# Patient Record
Sex: Male | Born: 1970 | Race: White | Hispanic: No | Marital: Married | Smoking: Former smoker
Health system: Southern US, Community
[De-identification: ages and names within clinical notes are randomized; demographics above are authoritative.]

## PROBLEM LIST (undated history)

## (undated) DIAGNOSIS — R569 Unspecified convulsions: Secondary | ICD-10-CM

## (undated) DIAGNOSIS — G43909 Migraine, unspecified, not intractable, without status migrainosus: Secondary | ICD-10-CM

## (undated) DIAGNOSIS — G4700 Insomnia, unspecified: Secondary | ICD-10-CM

## (undated) DIAGNOSIS — R0600 Dyspnea, unspecified: Secondary | ICD-10-CM

## (undated) DIAGNOSIS — R109 Unspecified abdominal pain: Secondary | ICD-10-CM

## (undated) DIAGNOSIS — G4730 Sleep apnea, unspecified: Secondary | ICD-10-CM

## (undated) DIAGNOSIS — F40240 Claustrophobia: Secondary | ICD-10-CM

## (undated) DIAGNOSIS — M1990 Unspecified osteoarthritis, unspecified site: Secondary | ICD-10-CM

## (undated) DIAGNOSIS — F32A Depression, unspecified: Secondary | ICD-10-CM

## (undated) DIAGNOSIS — L409 Psoriasis, unspecified: Secondary | ICD-10-CM

## (undated) DIAGNOSIS — M7520 Bicipital tendinitis, unspecified shoulder: Secondary | ICD-10-CM

## (undated) DIAGNOSIS — K219 Gastro-esophageal reflux disease without esophagitis: Secondary | ICD-10-CM

## (undated) DIAGNOSIS — I749 Embolism and thrombosis of unspecified artery: Secondary | ICD-10-CM

## (undated) DIAGNOSIS — F4310 Post-traumatic stress disorder, unspecified: Secondary | ICD-10-CM

## (undated) DIAGNOSIS — M25519 Pain in unspecified shoulder: Secondary | ICD-10-CM

## (undated) DIAGNOSIS — L4050 Arthropathic psoriasis, unspecified: Secondary | ICD-10-CM

## (undated) DIAGNOSIS — I10 Essential (primary) hypertension: Secondary | ICD-10-CM

## (undated) DIAGNOSIS — I824Z2 Acute embolism and thrombosis of unspecified deep veins of left distal lower extremity: Secondary | ICD-10-CM

## (undated) DIAGNOSIS — H9319 Tinnitus, unspecified ear: Secondary | ICD-10-CM

## (undated) DIAGNOSIS — K589 Irritable bowel syndrome without diarrhea: Secondary | ICD-10-CM

## (undated) HISTORY — PX: LASIK: SHX215

## (undated) HISTORY — DX: Unspecified abdominal pain: R10.9

## (undated) HISTORY — DX: Gastro-esophageal reflux disease without esophagitis: K21.9

## (undated) HISTORY — PX: COLONOSCOPY: SHX174

## (undated) HISTORY — DX: Migraine, unspecified, not intractable, without status migrainosus: G43.909

## (undated) HISTORY — PX: APPENDECTOMY: SHX54

## (undated) HISTORY — DX: Post-traumatic stress disorder, unspecified: F43.10

## (undated) HISTORY — DX: Tinnitus, unspecified ear: H93.19

---

## 2018-11-10 ENCOUNTER — Other Ambulatory Visit: Payer: Self-pay | Admitting: Internal Medicine

## 2018-11-10 ENCOUNTER — Other Ambulatory Visit (HOSPITAL_COMMUNITY): Payer: Self-pay | Admitting: Internal Medicine

## 2018-11-10 DIAGNOSIS — R1011 Right upper quadrant pain: Secondary | ICD-10-CM

## 2018-11-19 ENCOUNTER — Ambulatory Visit (HOSPITAL_COMMUNITY): Payer: Non-veteran care

## 2018-11-19 ENCOUNTER — Encounter (HOSPITAL_COMMUNITY): Payer: Self-pay

## 2018-11-19 ENCOUNTER — Other Ambulatory Visit: Payer: Self-pay | Admitting: Internal Medicine

## 2018-11-19 DIAGNOSIS — R1011 Right upper quadrant pain: Secondary | ICD-10-CM

## 2018-11-25 ENCOUNTER — Ambulatory Visit
Admission: RE | Admit: 2018-11-25 | Discharge: 2018-11-25 | Disposition: A | Discharge status: Home / Self Care | Payer: No Typology Code available for payment source | Source: Ambulatory Visit | Attending: Internal Medicine | Admitting: Internal Medicine

## 2018-11-25 DIAGNOSIS — R1011 Right upper quadrant pain: Secondary | ICD-10-CM | POA: Diagnosis not present

## 2020-03-12 ENCOUNTER — Encounter: Payer: Self-pay | Admitting: Gastroenterology

## 2020-04-17 ENCOUNTER — Other Ambulatory Visit (INDEPENDENT_AMBULATORY_CARE_PROVIDER_SITE_OTHER): Payer: No Typology Code available for payment source

## 2020-04-17 ENCOUNTER — Ambulatory Visit (INDEPENDENT_AMBULATORY_CARE_PROVIDER_SITE_OTHER): Payer: No Typology Code available for payment source | Admitting: Gastroenterology

## 2020-04-17 ENCOUNTER — Encounter: Payer: Self-pay | Admitting: Gastroenterology

## 2020-04-17 DIAGNOSIS — R195 Other fecal abnormalities: Secondary | ICD-10-CM

## 2020-04-17 DIAGNOSIS — R1013 Epigastric pain: Secondary | ICD-10-CM | POA: Diagnosis not present

## 2020-04-17 DIAGNOSIS — R1084 Generalized abdominal pain: Secondary | ICD-10-CM

## 2020-04-17 LAB — CBC
HCT: 47.8 % (ref 39.0–52.0)
Hemoglobin: 16.8 g/dL (ref 13.0–17.0)
MCHC: 35.2 g/dL (ref 30.0–36.0)
MCV: 95.8 fl (ref 78.0–100.0)
Platelets: 237 10*3/uL (ref 150.0–400.0)
RBC: 4.99 Mil/uL (ref 4.22–5.81)
RDW: 13.1 % (ref 11.5–15.5)
WBC: 7.3 10*3/uL (ref 4.0–10.5)

## 2020-04-17 LAB — COMPREHENSIVE METABOLIC PANEL
ALT: 24 U/L (ref 0–53)
AST: 26 U/L (ref 0–37)
Albumin: 4.5 g/dL (ref 3.5–5.2)
Alkaline Phosphatase: 88 U/L (ref 39–117)
BUN: 15 mg/dL (ref 6–23)
CO2: 27 mEq/L (ref 19–32)
Calcium: 9.8 mg/dL (ref 8.4–10.5)
Chloride: 102 mEq/L (ref 96–112)
Creatinine, Ser: 1.07 mg/dL (ref 0.40–1.50)
GFR: 72.97 mL/min (ref >60.00)
Glucose, Bld: 89 mg/dL (ref 70–99)
Potassium: 4 mEq/L (ref 3.5–5.1)
Sodium: 137 mEq/L (ref 135–145)
Total Bilirubin: 0.5 mg/dL (ref 0.2–1.2)
Total Protein: 7.4 g/dL (ref 6.0–8.3)

## 2020-04-17 LAB — SEDIMENTATION RATE: Sed Rate: 13 mm/hr (ref 0–20)

## 2020-04-17 LAB — IGA: IgA: 343 mg/dL (ref 68–378)

## 2020-04-17 NOTE — Patient Instructions (Signed)
If you are age 51 or older, your body mass index should be between 23-30. Your Body mass index is 39.2 kg/m. If this is out of the aforementioned range listed, please consider follow up with your Primary Care Provider.  If you are age 86 or younger, your body mass index should be between 19-25. Your Body mass index is 39.2 kg/m. If this is out of the aformentioned range listed, please consider follow up with your Primary Care Provider.   You have been scheduled for a CT scan of the abdomen and pelvis at [facility] ([address]---this is in the same building as [employer]).   You are scheduled on 04-19-20 at 2:30pm You should arrive 15 minutes prior to your appointment time for registration. Please follow the written instructions below on the day of your exam:  WARNING: IF YOU ARE ALLERGIC TO IODINE/X-RAY DYE, PLEASE NOTIFY RADIOLOGY IMMEDIATELY AT [phone]! YOU WILL BE GIVEN A 13 HOUR PREMEDICATION PREP.  1) Do not eat or drink anything after 10:30am (4 hours prior to your test) 2) You have been given 2 bottles of oral contrast to drink. The solution may taste better if refrigerated, but do NOT add ice or any other liquid to this solution. Shake well before drinking.    Drink 1 bottle of contrast @ 12:30pm (2 hours prior to your exam)  Drink 1 bottle of contrast @ 1:30pm (1 hour prior to your exam)  You may take any medications as prescribed with a small amount of water, if necessary. If you take any of the following medications: METFORMIN, GLUCOPHAGE, GLUCOVANCE, AVANDAMET, RIOMET, FORTAMET, [place] MET, JANUMET, GLUMETZA or METAGLIP, you MAY be asked to HOLD this medication 48 hours AFTER the exam.  The purpose of you drinking the oral contrast is to aid in the visualization of your intestinal tract. The contrast solution may cause some diarrhea. Depending on your individual set of symptoms, you may also receive an intravenous injection of x-ray contrast/dye.  Plan on being at [facility] for 30 minutes or longer, depending on the type of exam you are having performed.  This test typically takes 30-45 minutes to complete.  If you have any questions regarding your exam or if you need to reschedule, you may call the CT department at [phone] between the hours of 8:00 am and 5:00 pm, Monday-Friday.  __________________________________________________________  Your provider has requested that you go to the basement level for lab work before leaving today. Press "B" on the elevator. The lab is located at the first door on the left as you exit the elevator.   Due to recent changes in healthcare laws, you may see the results of your imaging and laboratory studies on MyChart before your provider has had a chance to review them.  We understand that in some cases there may be results that are confusing or concerning to you. Not all laboratory results come back in the same time frame and the provider may be waiting for multiple results in order to interpret others.  Please give [place] 48 hours in order for your provider to thoroughly review all the results before contacting the office for clarification of your results.   Thank you for entrusting me with your care and choosing [facility].  Dr Ardis Hughs

## 2020-04-17 NOTE — Progress Notes (Signed)
HPI: This is a very pleasant 49 year old man who is here with his wife today.  He was referred by a gastroenterologist at the [facility] [place] in [place].  He has had epigastric and generalized abdominal pains for many years.  He says the pain is a sharp pain.  Is nearly always there but can sometimes be worse.  He is not sure what makes it worse.  Eating does not necessarily make it worse.  He also has a hunger type pain.  He rarely has nausea with this and never vomits.  He has avoided gluten without any difference he is avoiding beer without any difference.  He is avoiding spicy foods without any difference.  He tells me that he has had testing through the [facility] [place] system including an ultrasound however he does not know the results of that.  This was over a year ago and he never heard back about those results.  He has intermittent heartburn at times especially at night.  He has tried several different antiacid medicines without much improvement.  He does not take NSAIDs.    He lost 10 pounds about 6 months ago and has not regained that.  He has loose stools frequently.  He would generally move his bowels 4-7 times a day.  Sometimes it is solid but oftentimes it is loose.  He has seen mild intermittent bright red blood per rectum.  Colon cancer does not run in his family  Old Data Reviewed: I reviewed a 62 page packet from the [facility] in [facility].  It looks like they are asking for a colonoscopy to be done.  There is very limited clinical information in the packet.  I believe they are asking that a colonoscopy done for abdominal pain and that his BMI is greater than 40 and so he needs MAC type sedation.  There is a medication list included.  It appears accurate.  There is a progress note included.  It looks like he was evaluated by a Gastroenterologist in their office March 2021 for "abdominal pain and diarrhea" the report reads that he has chronic abdominal pain and diarrhea.  "His symptoms are unchanged  and if anything it abdominal pain is worse.  He describes the discomfort as if he is "starving to death" he clarifies it is sharp sharp pain in the periumbilical area.  There was some lab test results included from 2 to 3 years prior.  In the assessment of this progress note the plan was "EGD and colonoscopy"  To be clear in the [facility] packet of information the most recent lab test results were from 2 years ago and there were no imaging results at all.  It looks like the referring physician was a gastroenterologist at the [facility] named Saundra Shelling, MD    Review of systems: Pertinent positive and negative review of systems were noted in the above HPI section. All other review negative.   Past Medical History:  Diagnosis Date  . Abdominal pain   . GERD (gastroesophageal reflux disease)   . PTSD (post-traumatic stress disorder)   . Tinnitus       Current Outpatient Medications  Medication Sig Dispense Refill  . DULoxetine HCl 20 MG CSDR Take 1 capsule by mouth daily.    Marland Kitchen gabapentin (NEURONTIN) 100 MG capsule Take 100 mg by mouth at bedtime.     No current facility-administered medications for this visit.    Allergies as of 04/17/2020  . (No Known Allergies)    Family History  Family history unknown: Yes    Social History   Socioeconomic History  . Marital status: Unknown    Spouse name: Not on file  . Number of children: Not on file  . Years of education: Not on file  . Highest education level: Not on file  Occupational History  . Not on file  Tobacco Use  . Smoking status: Former Research scientist (life sciences)  . Smokeless tobacco: Never Used  Vaping Use  . Vaping Use: Never used  Substance and Sexual Activity  . Alcohol use: Yes  . Drug use: Never  . Sexual activity: Not on file  Other Topics Concern  . Not on file  Social History Narrative  . Not on file   Social Determinants of Health   Financial Resource Strain:   . Difficulty of Paying Living Expenses:   Food Insecurity:   .  Worried About Charity fundraiser in the Last Year:   . Arboriculturist in the Last Year:   Transportation Needs:   . Film/video editor (Medical):   Marland Kitchen Lack of Transportation (Non-Medical):   Physical Activity:   . Days of Exercise per Week:   . Minutes of Exercise per Session:   Stress:   . Feeling of Stress :   Social Connections:   . Frequency of Communication with Friends and Family:   . Frequency of Social Gatherings with Friends and Family:   . Attends Religious Services:   . Active Member of Clubs or Organizations:   . Attends Archivist Meetings:   Marland Kitchen Marital Status:   Intimate Partner Violence:   . Fear of Current or Ex-Partner:   . Emotionally Abused:   Marland Kitchen Physically Abused:   . Sexually Abused:      Physical Exam: BP (!) 134/94 (BP Location: Left Arm, Patient Position: Sitting, Cuff Size: Large)   Pulse 100   Ht 5' 10.5" (1.791 m)   Wt 277 lb 2 oz (125.7 kg)   SpO2 100%   BMI 39.20 kg/m  Constitutional: generally well-appearing Psychiatric: alert and oriented x3 Eyes: extraocular movements intact Mouth: oral pharynx moist, no lesions Neck: supple no lymphadenopathy Cardiovascular: heart regular rate and rhythm Lungs: clear to auscultation bilaterally Abdomen: soft, nontender, nondistended, no obvious ascites, no peritoneal signs, normal bowel sounds Extremities: no lower extremity edema bilaterally Skin: no lesions on visible extremities   Assessment and plan: 49 y.o. male with generalized abdominal pains, frequent stools, epigastric abdominal pains  [facility] however I do not think that those are the best first tests for him.  He has not had blood testing in quite a while.  I do not believe he has ever had abdominal imaging's with a CT scan.  I recommended that we start his work-up with CT scan abdomen pelvis with IV and oral contrast as well as lab tests including a CBC,  complete metabolic profile, sed rate, TTG and total IgA as well as stool for GI pathogen panel.  He understands that if no obvious causes are noted on the above testing that he will probably need further testing which may include colonoscopy and also upper endoscopy.    Please see the "Patient Instructions" section for addition details about the plan.   Owens Loffler, MD [facility] 04/17/2020, 3:14 PM  Cc: Irean Hong, FNP  Total time on date of encounter was 45  minutes (this included time spent preparing to see the  patient reviewing records; obtaining and/or reviewing separately obtained history; performing a medically appropriate exam and/or evaluation; counseling and educating the patient and family if present; ordering medications, tests or procedures if applicable; and documenting clinical information in the health record).

## 2020-04-18 LAB — TISSUE TRANSGLUTAMINASE, IGA: (tTG) Ab, IgA: 1 U/mL

## 2020-04-19 ENCOUNTER — Other Ambulatory Visit: Payer: No Typology Code available for payment source

## 2020-04-19 ENCOUNTER — Other Ambulatory Visit: Payer: Self-pay

## 2020-04-19 ENCOUNTER — Ambulatory Visit (INDEPENDENT_AMBULATORY_CARE_PROVIDER_SITE_OTHER)
Admission: RE | Admit: 2020-04-19 | Discharge: 2020-04-19 | Disposition: A | Discharge status: Home / Self Care | Payer: No Typology Code available for payment source | Source: Ambulatory Visit | Attending: Gastroenterology | Admitting: Gastroenterology

## 2020-04-19 DIAGNOSIS — R195 Other fecal abnormalities: Secondary | ICD-10-CM

## 2020-04-19 DIAGNOSIS — R1013 Epigastric pain: Secondary | ICD-10-CM

## 2020-04-19 DIAGNOSIS — R1084 Generalized abdominal pain: Secondary | ICD-10-CM | POA: Diagnosis not present

## 2020-04-19 MED ORDER — IOHEXOL 300 MG/ML  SOLN
100.0000 mL | Freq: Once | INTRAMUSCULAR | Status: AC | PRN
  Administered 2020-04-19: 100 mL via INTRAVENOUS

## 2020-04-20 ENCOUNTER — Other Ambulatory Visit: Payer: Self-pay

## 2020-04-20 DIAGNOSIS — Z1159 Encounter for screening for other viral diseases: Secondary | ICD-10-CM

## 2020-04-22 LAB — GI PROFILE, STOOL, PCR

## 2020-05-17 ENCOUNTER — Ambulatory Visit (AMBULATORY_SURGERY_CENTER): Payer: Self-pay

## 2020-05-17 ENCOUNTER — Other Ambulatory Visit: Payer: Self-pay

## 2020-05-17 VITALS — Ht 70.5 in | Wt 271.0 lb

## 2020-05-17 DIAGNOSIS — Z01818 Encounter for other preprocedural examination: Secondary | ICD-10-CM

## 2020-05-17 DIAGNOSIS — R1013 Epigastric pain: Secondary | ICD-10-CM

## 2020-05-17 DIAGNOSIS — R195 Other fecal abnormalities: Secondary | ICD-10-CM

## 2020-05-17 DIAGNOSIS — R1084 Generalized abdominal pain: Secondary | ICD-10-CM

## 2020-05-17 NOTE — Progress Notes (Signed)
No egg or soy allergy known to patient  No issues with past sedation with any surgeries or procedures no intubation problems in the past  No diet pills per patient No home 02 use per patient  No blood thinners per patient  Pt denies issues with constipation  No A fib or A flutter  EMMI video to pt or MyChart  COVID 19 guidelines implemented in [place] today   Pt has golytely prep from April at home.    Due to the COVID-19 pandemic we are asking patients to follow these guidelines. Please only bring one care partner. Please be aware that your care partner may wait in the car in the parking lot or if they feel like they will be too hot to wait in the car, they may wait in the lobby on the 4th floor. All care partners are required to wear a mask the entire time (we do not have any that we can provide them), they need to practice social distancing, and we will do a Covid check for all patient's and care partners when you arrive. Also we will check their temperature and your temperature. If the care partner waits in their car they need to stay in the parking lot the entire time and we will call them on their cell phone when the patient is ready for discharge so they can bring the car to the front of the building. Also all patient's will need to wear a mask into building.

## 2020-05-23 ENCOUNTER — Other Ambulatory Visit: Payer: Self-pay | Admitting: Gastroenterology

## 2020-05-23 ENCOUNTER — Ambulatory Visit (INDEPENDENT_AMBULATORY_CARE_PROVIDER_SITE_OTHER): Payer: No Typology Code available for payment source

## 2020-05-23 DIAGNOSIS — Z1159 Encounter for screening for other viral diseases: Secondary | ICD-10-CM

## 2020-05-24 LAB — SARS CORONAVIRUS 2 (TAT 6-24 HRS): SARS Coronavirus 2: NEGATIVE

## 2020-05-25 ENCOUNTER — Other Ambulatory Visit: Payer: Self-pay

## 2020-05-25 ENCOUNTER — Encounter: Payer: Self-pay | Admitting: Gastroenterology

## 2020-05-25 ENCOUNTER — Ambulatory Visit (AMBULATORY_SURGERY_CENTER): Payer: No Typology Code available for payment source | Admitting: Gastroenterology

## 2020-05-25 VITALS — BP 140/90 | HR 70 | Temp 98.0°F | Resp 12 | Ht 70.0 in | Wt 271.0 lb

## 2020-05-25 DIAGNOSIS — K2970 Gastritis, unspecified, without bleeding: Secondary | ICD-10-CM | POA: Diagnosis not present

## 2020-05-25 DIAGNOSIS — K2980 Duodenitis without bleeding: Secondary | ICD-10-CM | POA: Diagnosis not present

## 2020-05-25 DIAGNOSIS — R197 Diarrhea, unspecified: Secondary | ICD-10-CM

## 2020-05-25 DIAGNOSIS — D123 Benign neoplasm of transverse colon: Secondary | ICD-10-CM

## 2020-05-25 DIAGNOSIS — R1013 Epigastric pain: Secondary | ICD-10-CM | POA: Diagnosis not present

## 2020-05-25 DIAGNOSIS — K449 Diaphragmatic hernia without obstruction or gangrene: Secondary | ICD-10-CM

## 2020-05-25 DIAGNOSIS — D124 Benign neoplasm of descending colon: Secondary | ICD-10-CM

## 2020-05-25 DIAGNOSIS — R195 Other fecal abnormalities: Secondary | ICD-10-CM

## 2020-05-25 DIAGNOSIS — D125 Benign neoplasm of sigmoid colon: Secondary | ICD-10-CM | POA: Diagnosis not present

## 2020-05-25 MED ORDER — SODIUM CHLORIDE 0.9 % IV SOLN: 500.0000 mL | INTRAVENOUS | Status: DC

## 2020-05-25 NOTE — Op Note (Signed)
[place] Patient Name: Saunders Arlington Procedure Date: 05/25/2020 2:17 PM MRN: [id number] Endoscopist: Milus Banister , MD Age: 49 Referring MD:  Date of Birth: 1971-09-18 Gender: Male Account #: [id number] Procedure:                Colonoscopy Indications:              loose stools, abdominal pain Medicines:                Monitored Anesthesia Care Procedure:                Pre-Anesthesia Assessment:                           - Prior to the procedure, a History and Physical                            was performed, and patient medications and                            allergies were reviewed. The patient's tolerance of                            previous anesthesia was also reviewed. The risks                            and benefits of the procedure and the sedation                            options and risks were discussed with the patient.                            All questions were answered, and informed consent                            was obtained. Prior Anticoagulants: The patient has                            taken no previous anticoagulant or antiplatelet                            agents. ASA Grade Assessment: II - A patient with                            mild systemic disease. After reviewing the risks                            and benefits, the patient was deemed in                            satisfactory condition to undergo the procedure.                           After obtaining informed consent, the colonoscope  was passed under direct vision. Throughout the                            procedure, the patient's blood pressure, pulse, and                            oxygen saturations were monitored continuously. The                            Colonoscope was introduced through the anus and                            advanced to the the terminal ileum. The colonoscopy                            was performed without  difficulty. The patient                            tolerated the procedure well. The quality of the                            bowel preparation was good. The terminal ileum,                            ileocecal valve, appendiceal orifice, and rectum                            were photographed. Scope In: 2:24:28 PM Scope Out: 2:42:33 PM Scope Withdrawal Time: 0 hours 16 minutes 18 seconds  Total Procedure Duration: 0 hours 18 minutes 5 seconds  Findings:                 The terminal ileum appeared normal.                           Five sessile polyps were found in the sigmoid                            colon, descending colon and transverse colon. The                            polyps were 3 to 9 mm in size. These polyps were                            removed with a cold snare. Resection and retrieval                            were complete.                           Multiple small and large-mouthed diverticula were                            found in the left colon.  The exam was otherwise without abnormality on                            direct and retroflexion views.                           Biopsies for histology were taken with a cold                            forceps from the entire colon for evaluation of                            microscopic colitis. Complications:            No immediate complications. Estimated blood loss:                            None. Estimated Blood Loss:     Estimated blood loss: none. Impression:               - The examined portion of the ileum was normal.                           - Five 3 to 9 mm polyps in the sigmoid colon, in                            the descending colon and in the transverse colon,                            removed with a cold snare. Resected and retrieved.                           - Diverticulosis in the left colon.                           - The examination was otherwise normal on direct                             and retroflexion views.                           - Biopsies were taken with a cold forceps from the                            entire colon for evaluation of microscopic colitis. Recommendation:           - EGD now.                           - Await pathology results. Milus Banister, MD 05/25/2020 2:51:07 PM This report has been signed electronically.

## 2020-05-25 NOTE — Progress Notes (Signed)
Called to room to assist during endoscopic procedure.  Patient ID and intended procedure confirmed with present staff. Received instructions for my participation in the procedure from the performing physician.  

## 2020-05-25 NOTE — Patient Instructions (Signed)
Handouts on polyps and diverticulosis given to you today  Await pathology results on polyps removed and biosies done  Handout on gastritis given to you today  Await pathology results on biopsies done    YOU HAD AN ENDOSCOPIC PROCEDURE TODAY AT [facility]:   Refer to the procedure report that was given to you for any specific questions about what was found during the examination.  If the procedure report does not answer your questions, please call your gastroenterologist to clarify.  If you requested that your care partner not be given the details of your procedure findings, then the procedure report has been included in a sealed envelope for you to review at your convenience later.  YOU SHOULD EXPECT: Some feelings of bloating in the abdomen. Passage of more gas than usual.  Walking can help get rid of the air that was put into your GI tract during the procedure and reduce the bloating. If you had a lower endoscopy (such as a colonoscopy or flexible sigmoidoscopy) you may notice spotting of blood in your stool or on the toilet paper. If you underwent a bowel prep for your procedure, you may not have a normal bowel movement for a few days.  Please Note:  You might notice some irritation and congestion in your nose or some drainage.  This is from the oxygen used during your procedure.  There is no need for concern and it should clear up in a day or so.  SYMPTOMS TO REPORT IMMEDIATELY:   Following lower endoscopy (colonoscopy or flexible sigmoidoscopy):  Excessive amounts of blood in the stool  Significant tenderness or worsening of abdominal pains  Swelling of the abdomen that is new, acute  Fever of 100F or higher   Following upper endoscopy (EGD)  Vomiting of blood or coffee ground material  New chest pain or pain under the shoulder blades  Painful or persistently difficult swallowing  New shortness of breath  Fever of 100F or higher  Black, tarry-looking  stools  For urgent or emergent issues, a gastroenterologist can be reached at any hour by calling [phone]. Do not use MyChart messaging for urgent concerns.    DIET:  We do recommend a small meal at first, but then you may proceed to your regular diet.  Drink plenty of fluids but you should avoid alcoholic beverages for 24 hours.  ACTIVITY:  You should plan to take it easy for the rest of today and you should NOT DRIVE or use heavy machinery until tomorrow (because of the sedation medicines used during the test).    FOLLOW UP: Our staff will call the number listed on your records 48-72 hours following your procedure to check on you and address any questions or concerns that you may have regarding the information given to you following your procedure. If we do not reach you, we will leave a message.  We will attempt to reach you two times.  During this call, we will ask if you have developed any symptoms of COVID 19. If you develop any symptoms (ie: fever, flu-like symptoms, shortness of breath, cough etc.) before then, please call [phone].  If you test positive for Covid 19 in the 2 weeks post procedure, please call and report this information to [place].    If any biopsies were taken you will be contacted by phone or by letter within the next 1-3 weeks.  Please call us at [phone] if you have not heard about the  biopsies in 3 weeks.    SIGNATURES/CONFIDENTIALITY: You and/or your care partner have signed paperwork which will be entered into your electronic medical record.  These signatures attest to the fact that that the information above on your After Visit Summary has been reviewed and is understood.  Full responsibility of the confidentiality of this discharge information lies with you and/or your care-partner.

## 2020-05-25 NOTE — Progress Notes (Signed)
Vs CW I have reviewed the patient's medical history in detail and updated the computerized patient record.   

## 2020-05-25 NOTE — Progress Notes (Signed)
PT taken to PACU. Monitors in place. VSS. Report given to RN. 

## 2020-05-25 NOTE — Op Note (Signed)
[place] Patient Name: Mike Cooley Procedure Date: 05/25/2020 2:17 PM MRN: [id number] Endoscopist: Milus Banister , MD Age: 49 Referring MD:  Date of Birth: 07-Feb-1971 Gender: Male Account #: [id number] Procedure:                Upper GI endoscopy Indications:              Generalized abdominal pain Medicines:                Monitored Anesthesia Care Procedure:                Pre-Anesthesia Assessment:                           - Prior to the procedure, a History and Physical                            was performed, and patient medications and                            allergies were reviewed. The patient's tolerance of                            previous anesthesia was also reviewed. The risks                            and benefits of the procedure and the sedation                            options and risks were discussed with the patient.                            All questions were answered, and informed consent                            was obtained. Prior Anticoagulants: The patient has                            taken no previous anticoagulant or antiplatelet                            agents. ASA Grade Assessment: II - A patient with                            mild systemic disease. After reviewing the risks                            and benefits, the patient was deemed in                            satisfactory condition to undergo the procedure.                           After obtaining informed consent, the endoscope was  passed under direct vision. Throughout the                            procedure, the patient's blood pressure, pulse, and                            oxygen saturations were monitored continuously. The                            Endoscope was introduced through the mouth, and                            advanced to the second part of duodenum. The upper                            GI endoscopy was accomplished  without difficulty.                            The patient tolerated the procedure well. Scope In: Scope Out: Findings:                 Small hiatal hernia.                           Moderate inflammation characterized by erosions,                            erythema and friability was found in the entire                            examined stomach. Biopsies were taken with a cold                            forceps for histology.                           Non-specific duodenitis (erythema and one erosion                            in the bulb)                           The exam was otherwise without abnormality. Complications:            No immediate complications. Estimated blood loss:                            None. Estimated Blood Loss:     Estimated blood loss: none. Impression:               - Small hiatal hernia.                           - Non specific gastritis (biopsied) and duodenitis.                           - The examination was otherwise normal.  Recommendation:           - Patient has a contact number available for                            emergencies. The signs and symptoms of potential                            delayed complications were discussed with the                            patient. Return to normal activities tomorrow.                            Written discharge instructions were provided to the                            patient.                           - Resume previous diet.                           - Continue present medications.                           - Await pathology results. Milus Banister, MD 05/25/2020 2:55:23 PM This report has been signed electronically.

## 2020-05-29 ENCOUNTER — Telehealth: Payer: Self-pay | Admitting: *Deleted

## 2020-05-29 NOTE — Telephone Encounter (Signed)
First attempt, left VM.  

## 2020-08-15 ENCOUNTER — Other Ambulatory Visit (HOSPITAL_BASED_OUTPATIENT_CLINIC_OR_DEPARTMENT_OTHER): Payer: Self-pay

## 2020-08-15 DIAGNOSIS — G4733 Obstructive sleep apnea (adult) (pediatric): Secondary | ICD-10-CM

## 2020-08-31 ENCOUNTER — Other Ambulatory Visit: Payer: Self-pay

## 2020-08-31 ENCOUNTER — Ambulatory Visit (HOSPITAL_BASED_OUTPATIENT_CLINIC_OR_DEPARTMENT_OTHER): Payer: No Typology Code available for payment source | Attending: Internal Medicine | Admitting: Internal Medicine

## 2020-08-31 VITALS — Ht 69.0 in | Wt 263.0 lb

## 2020-08-31 DIAGNOSIS — G4730 Sleep apnea, unspecified: Secondary | ICD-10-CM | POA: Diagnosis present

## 2020-08-31 DIAGNOSIS — G4761 Periodic limb movement disorder: Secondary | ICD-10-CM | POA: Insufficient documentation

## 2020-08-31 DIAGNOSIS — G4733 Obstructive sleep apnea (adult) (pediatric): Secondary | ICD-10-CM | POA: Diagnosis not present

## 2020-09-03 ENCOUNTER — Other Ambulatory Visit: Payer: Self-pay

## 2020-09-09 DIAGNOSIS — G4733 Obstructive sleep apnea (adult) (pediatric): Secondary | ICD-10-CM

## 2020-09-09 NOTE — Procedures (Signed)
   Patient Name: Mike Cooley, Mike Cooley Date: 08/31/2020 Gender: Male D.O.B: Oct 25, 1971 Age (years): 29 Referring Provider: Windy Fast Height (inches): 23 Interpreting Physician: Baird Lyons MD, ABSM Weight (lbs): 263 RPSGT: Earney Hamburg BMI: 39 MRN: [id number] Neck Size: 17.00  CLINICAL INFORMATION Sleep Study Type: NPSG Indication for sleep study: OSA Epworth Sleepiness Score: 17  SLEEP STUDY TECHNIQUE As per the AASM Manual for the Scoring of Sleep and Associated Events v2.3 (April 2016) with a hypopnea requiring 4% desaturations.  The channels recorded and monitored were frontal, central and occipital EEG, electrooculogram (EOG), submentalis EMG (chin), nasal and oral airflow, thoracic and abdominal wall motion, anterior tibialis EMG, snore microphone, electrocardiogram, and pulse oximetry.  MEDICATIONS Medications self-administered by patient taken the night of the study : none reported  SLEEP ARCHITECTURE The study was initiated at 9:46:35 PM and ended at 3:50:38 AM.  Sleep onset time was 24.9 minutes and the sleep efficiency was 67.7%%. The total sleep time was 246.6 minutes.  Stage REM latency was 111.5 minutes.  The patient spent 2.2%% of the night in stage N1 sleep, 75.3%% in stage N2 sleep, 0.6%% in stage N3 and 21.9% in REM.  Alpha intrusion was absent.  Supine sleep was 28.88%.  RESPIRATORY PARAMETERS The overall apnea/hypopnea index (AHI) was 9.5 per hour. There were 3 total apneas, including 3 obstructive, 0 central and 0 mixed apneas. There were 36 hypopneas and 14 RERAs.  The AHI during Stage REM sleep was 32.2 per hour.  AHI while supine was 5.9 per hour.  The mean oxygen saturation was 90.7%. The minimum SpO2 during sleep was 78.0%.  moderate snoring was noted during this study.  CARDIAC DATA The 2 lead EKG demonstrated sinus rhythm. The mean heart rate was 68.6 beats per minute. Other EKG findings include: None.  LEG MOVEMENT DATA The  total PLMS were 0 with a resulting PLMS index of 0.0. Associated arousal with leg movement index was 11.9 .  IMPRESSIONS - Mild obstructive sleep apnea occurred during this study (AHI = 9.5/h). Most events were in REM. - No significant central sleep apnea occurred during this study (CAI = 0.0/h). - Moderate oxygen desaturation was noted during this study (Min O2 = 78.0%). Mean O2 sat 90.7%. - The patient snored with moderate snoring volume. - No cardiac abnormalities were noted during this study. - Clinically significant periodic limb movements did occur during sleep. Limb movement with arousal 11.9/ hr.  DIAGNOSIS - Obstructive Sleep Apnea (G47.33) - Periodic Limb Movement During Sleep (G47.61)  RECOMMENDATIONS - Treatment for mild OSA is directed at symptoms. Conservative measures may include observation, weight loss and sleep position off back. Other options, including CPAP, a fitted oral appliance, or ENT evaluation, would be based on clinical judgment. - Consider trial of Mirapex, Requip, or Sinemet for treatment of Periodic Leg Movements of Sleep. - Sleep hygiene should be reviewed to assess factors that may improve sleep quality. - Weight management and regular exercise should be initiated or continued if appropriate.  [Electronically signed] 09/09/2020 11:05 AM  Baird Lyons MD, ABSM Diplomate, American Board of Sleep Medicine   NPI: [id number]                         [place], [place] [facility]  ELECTRONICALLY SIGNED ON:  09/09/2020, 10:58 AM [place] PH: [phone]   FX: [phone] [place]

## 2021-06-02 ENCOUNTER — Emergency Department
Admission: EM | Admit: 2021-06-02 | Discharge: 2021-06-02 | Disposition: A | Discharge status: Home / Self Care | Payer: No Typology Code available for payment source | Attending: Emergency Medicine | Admitting: Emergency Medicine

## 2021-06-02 ENCOUNTER — Emergency Department: Payer: No Typology Code available for payment source

## 2021-06-02 ENCOUNTER — Other Ambulatory Visit: Payer: Self-pay

## 2021-06-02 ENCOUNTER — Encounter: Payer: Self-pay | Admitting: Emergency Medicine

## 2021-06-02 DIAGNOSIS — Z87891 Personal history of nicotine dependence: Secondary | ICD-10-CM | POA: Diagnosis not present

## 2021-06-02 DIAGNOSIS — S99912A Unspecified injury of left ankle, initial encounter: Secondary | ICD-10-CM | POA: Diagnosis present

## 2021-06-02 DIAGNOSIS — W208XXA Other cause of strike by thrown, projected or falling object, initial encounter: Secondary | ICD-10-CM | POA: Insufficient documentation

## 2021-06-02 DIAGNOSIS — Y9355 Activity, bike riding: Secondary | ICD-10-CM | POA: Insufficient documentation

## 2021-06-02 DIAGNOSIS — S82892A Other fracture of left lower leg, initial encounter for closed fracture: Secondary | ICD-10-CM | POA: Diagnosis not present

## 2021-06-02 MED ORDER — HYDROCODONE-ACETAMINOPHEN 5-325 MG PO TABS: 1.0000 | ORAL_TABLET | Freq: Four times a day (QID) | ORAL | 0 refills | Status: AC | PRN

## 2021-06-02 MED ORDER — HYDROCODONE-ACETAMINOPHEN 5-325 MG PO TABS
2.0000 | ORAL_TABLET | Freq: Once | ORAL | Status: AC
  Administered 2021-06-02: 2 via ORAL
  Filled 2021-06-02: qty 2

## 2021-06-02 MED ORDER — IBUPROFEN 800 MG PO TABS
800.0000 mg | ORAL_TABLET | ORAL | Status: AC
  Administered 2021-06-02: 800 mg via ORAL
  Filled 2021-06-02: qty 1
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
## 2021-06-06 ENCOUNTER — Ambulatory Visit
Admission: RE | Admit: 2021-06-06 | Discharge: 2021-06-06 | Disposition: A | Discharge status: Home / Self Care | Payer: No Typology Code available for payment source | Source: Ambulatory Visit | Attending: Sports Medicine | Admitting: Sports Medicine

## 2021-06-06 ENCOUNTER — Other Ambulatory Visit: Payer: Self-pay

## 2021-06-06 ENCOUNTER — Other Ambulatory Visit: Payer: Self-pay | Admitting: Sports Medicine

## 2021-06-06 DIAGNOSIS — M7989 Other specified soft tissue disorders: Secondary | ICD-10-CM | POA: Diagnosis not present

## 2021-06-18 IMAGING — CT CT ABD-PELV W/ CM
2 of 5 series · 17 of 46 positions shown, 19 images · IV contrast (omnipaque)
Comparison: Abdominal sonogram November 25, 2018

CLINICAL DATA: Generalized abdominal pain, loose stools and
epigastric pain

EXAM:
CT ABDOMEN AND PELVIS WITH CONTRAST
TECHNIQUE: Multidetector CT imaging of the abdomen and pelvis was performed
using the standard protocol following bolus administration of
intravenous contrast.
CONTRAST:  100mL OMNIPAQUE IOHEXOL 300 MG/ML  SOLN

[Series 2: abd/pel w · axial · 0.96mm/px · z∈[-557,-72]mm · 14 of 109 slices shown, 16 images]
[im 6/109  soft-tissue]
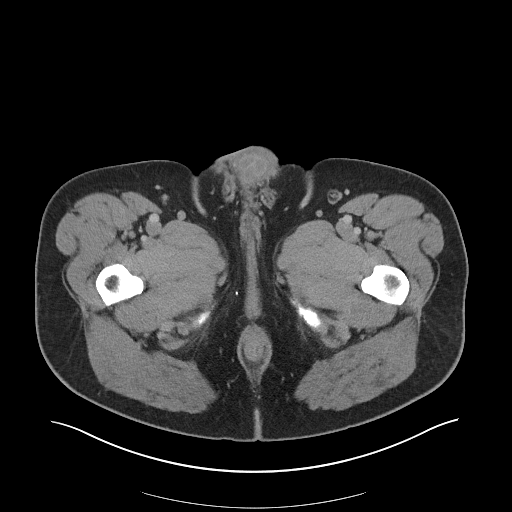
[im 6/109  bone]
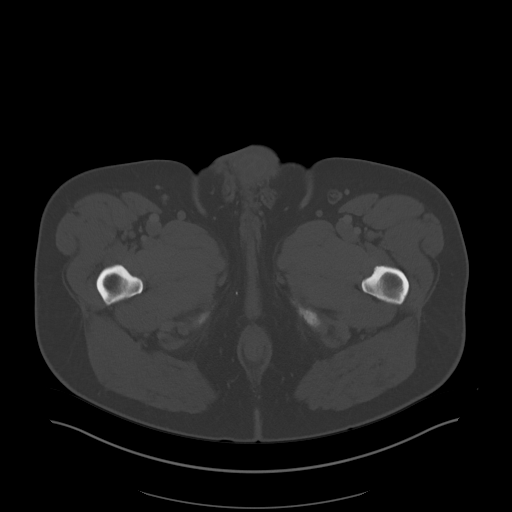
[im 12/109  soft-tissue]
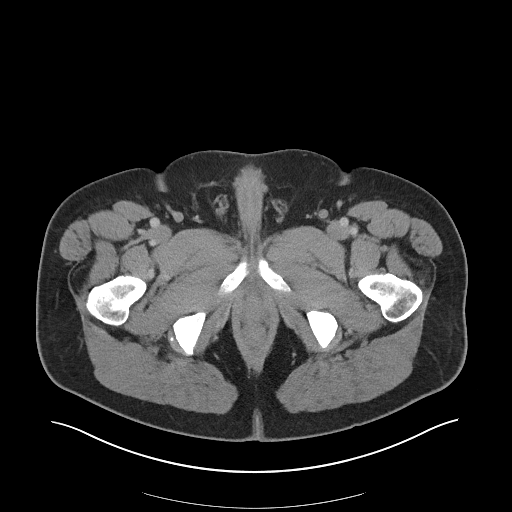
[im 23/109  soft-tissue]
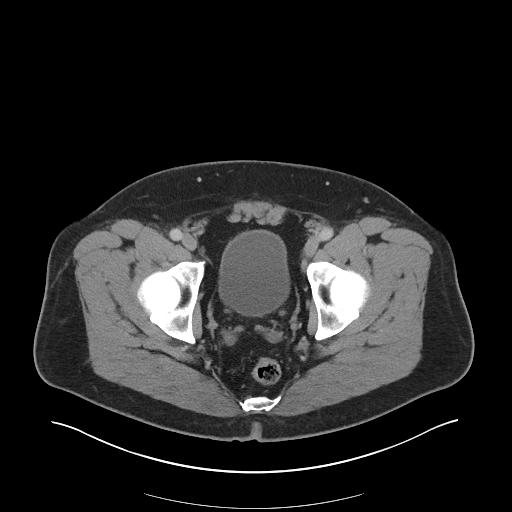
[im 29/109  soft-tissue]
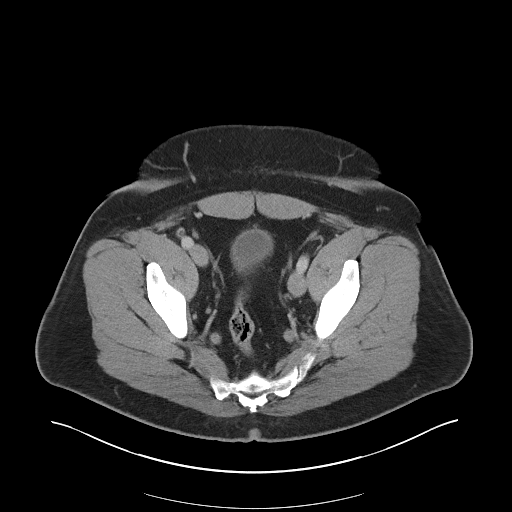
[im 35/109  soft-tissue]
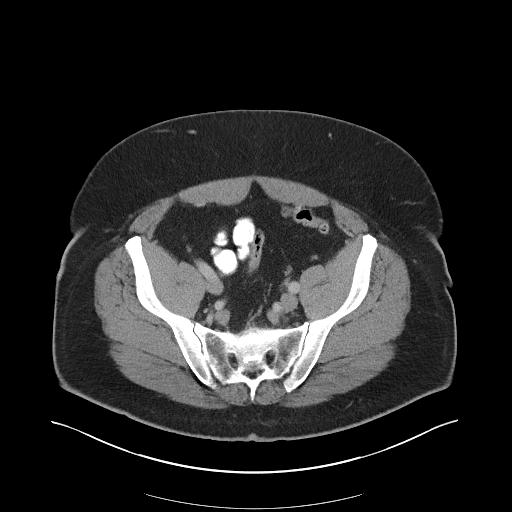
[im 46/109  soft-tissue]
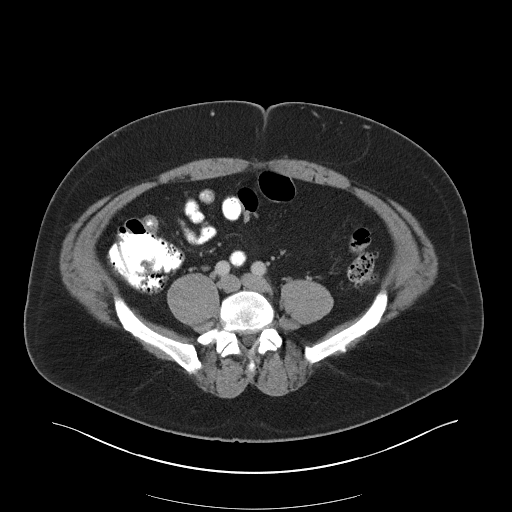
[im 52/109  soft-tissue]
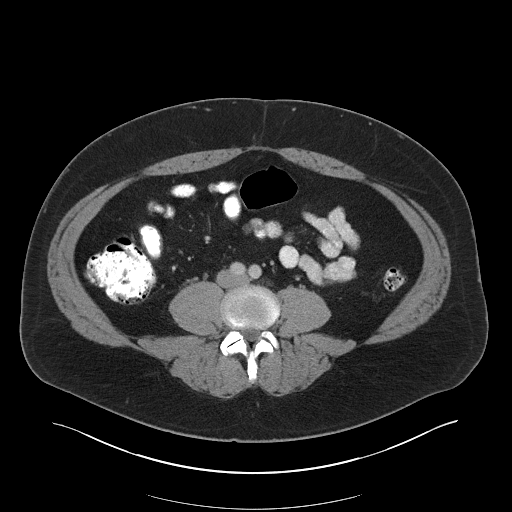
[im 57/109  soft-tissue]
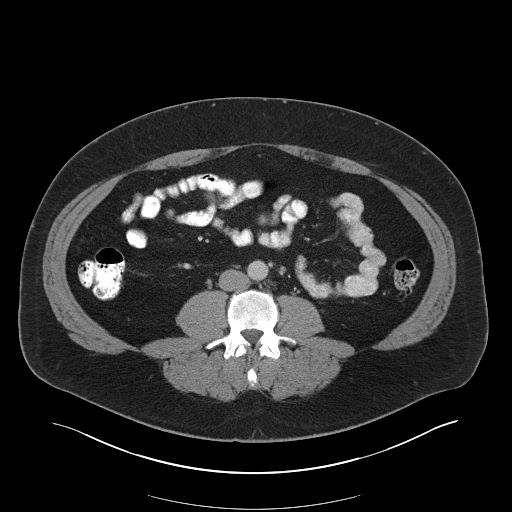
[im 63/109  soft-tissue]
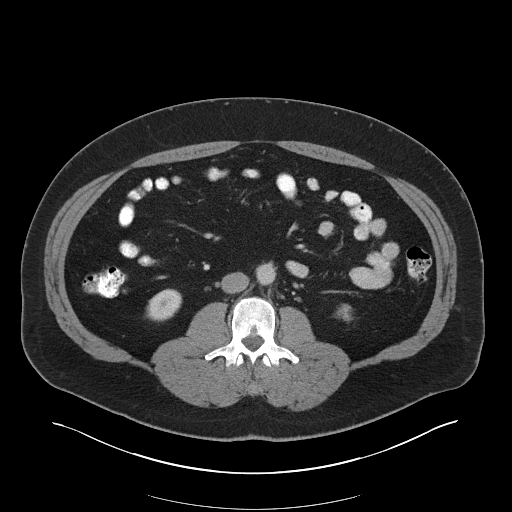
[im 63/109  bone]
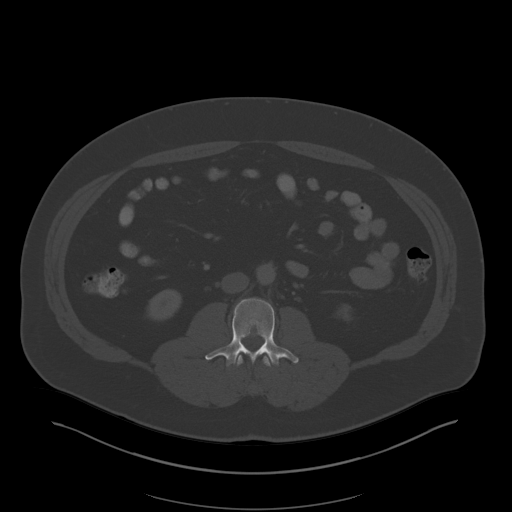
[im 74/109  soft-tissue]
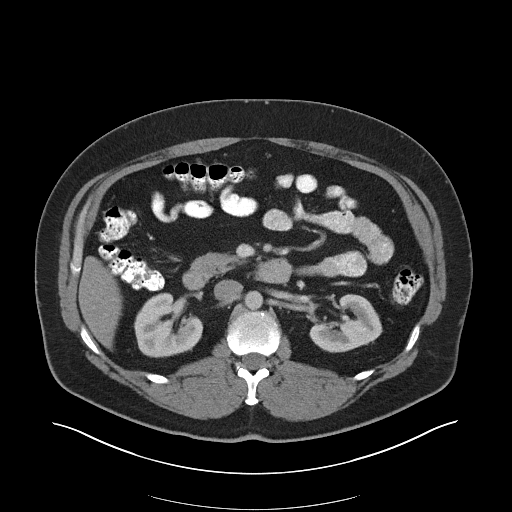
[im 80/109  soft-tissue]
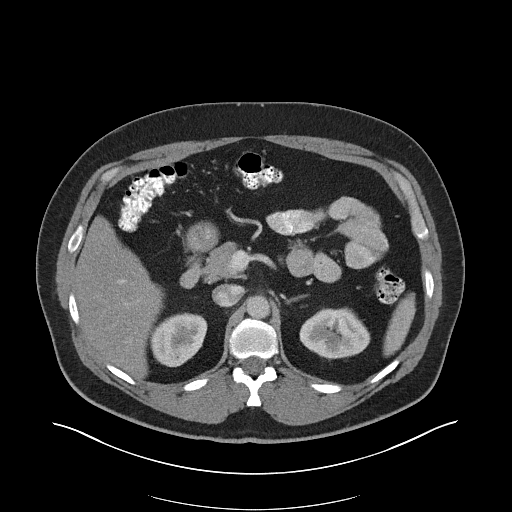
[im 86/109  soft-tissue]
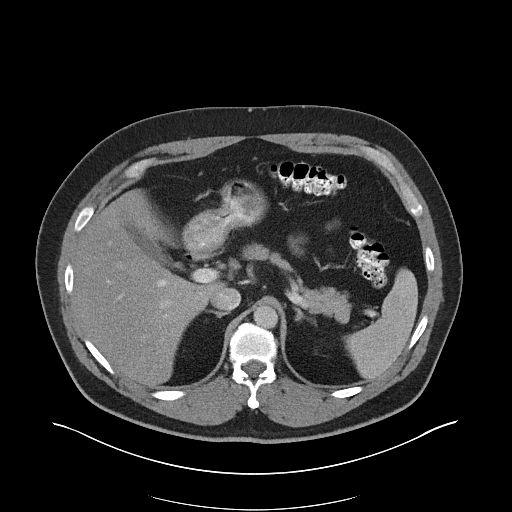
[im 97/109  soft-tissue]
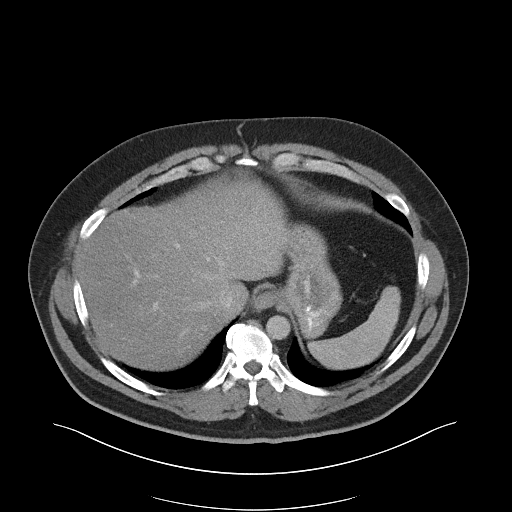
[im 103/109  soft-tissue]
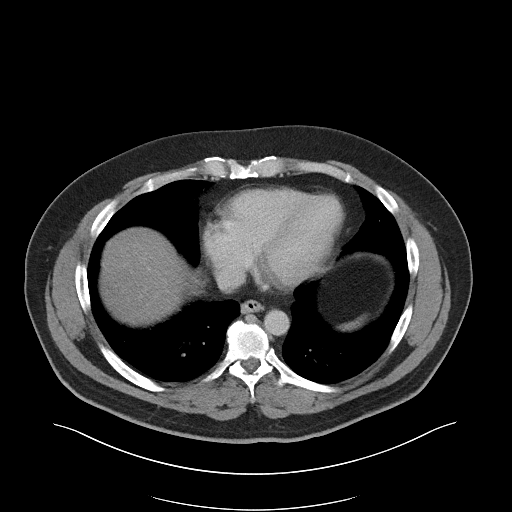

[Series 5: abd/pel w st · coronal · 0.94mm/px · 3 of 106 slices shown]
[im 36/106  soft-tissue]
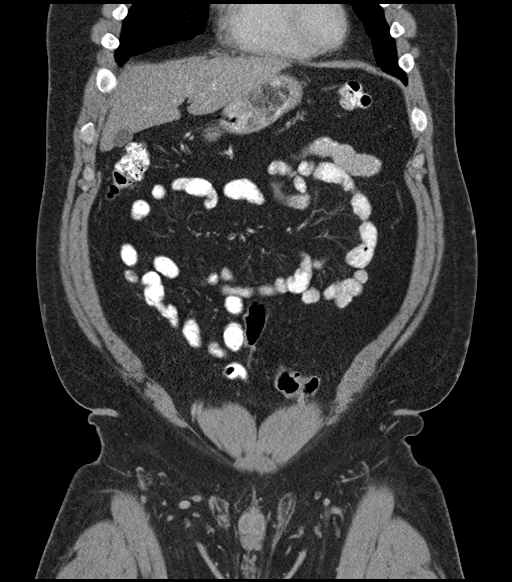
[im 47/106  soft-tissue]
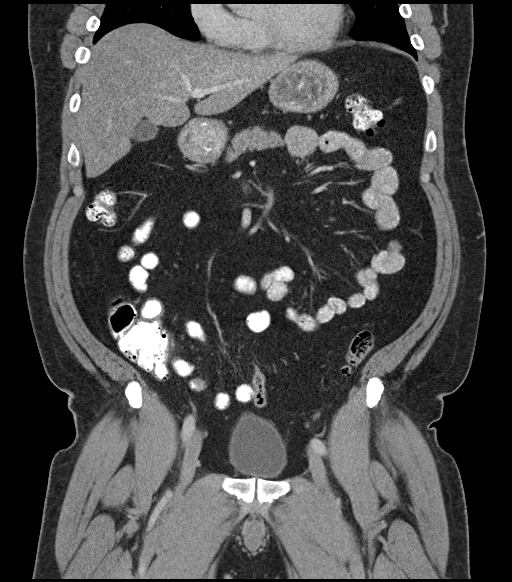
[im 59/106  soft-tissue]
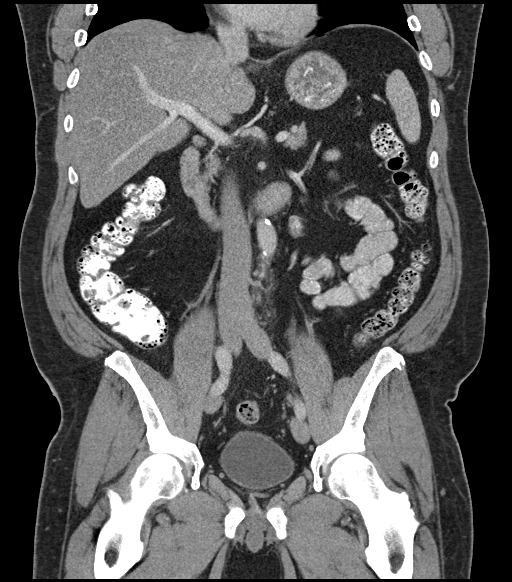

[17 of 46 positions shown; findings below may reference images not displayed]

FINDINGS: Lower chest: Lung bases are clear.

Hepatobiliary: Hepatic steatosis. Gallbladder is normal. No biliary
duct dilation. Portal vein is patent.

Pancreas: Pancreas is normal.

Spleen: Spleen normal size and contour.

Adrenals/Urinary Tract: Adrenal glands are normal. Symmetric renal
enhancement without focal suspicious lesion. Urinary bladder is
normal.

Stomach/Bowel: No acute gastrointestinal process. Signs of colonic
diverticulosis.

Vascular/Lymphatic: Scattered calcified and noncalcified plaque of
the abdominal aorta. No aneurysm. No adenopathy in the
retroperitoneum.

No adenopathy in the pelvis.

Reproductive: Prostate with calcifications, nonspecific finding.

Other: No ascites.  No free air.  No abdominal wall hernia.

Musculoskeletal: No acute musculoskeletal process.
IMPRESSION: 1. No acute findings in the abdomen or pelvis.
2. Hepatic steatosis.
3. Signs of colonic diverticulosis without evidence of acute
diverticulitis.
4. Aortic atherosclerosis.

Aortic Atherosclerosis ([id number]).

## 2021-09-11 ENCOUNTER — Other Ambulatory Visit: Payer: Self-pay | Admitting: Internal Medicine

## 2021-09-11 DIAGNOSIS — I824Z2 Acute embolism and thrombosis of unspecified deep veins of left distal lower extremity: Secondary | ICD-10-CM

## 2021-09-11 DIAGNOSIS — R600 Localized edema: Secondary | ICD-10-CM

## 2021-09-27 ENCOUNTER — Ambulatory Visit
Admission: RE | Admit: 2021-09-27 | Discharge: 2021-09-27 | Disposition: A | Discharge status: Home / Self Care | Payer: No Typology Code available for payment source | Source: Ambulatory Visit | Attending: Internal Medicine | Admitting: Internal Medicine

## 2021-09-27 ENCOUNTER — Other Ambulatory Visit: Payer: Self-pay

## 2021-09-27 DIAGNOSIS — R600 Localized edema: Secondary | ICD-10-CM

## 2021-09-27 DIAGNOSIS — I824Z2 Acute embolism and thrombosis of unspecified deep veins of left distal lower extremity: Secondary | ICD-10-CM

## 2021-10-01 ENCOUNTER — Other Ambulatory Visit: Payer: Self-pay | Admitting: Internal Medicine

## 2021-10-01 DIAGNOSIS — I824Z3 Acute embolism and thrombosis of unspecified deep veins of distal lower extremity, bilateral: Secondary | ICD-10-CM

## 2021-10-01 DIAGNOSIS — R600 Localized edema: Secondary | ICD-10-CM

## 2021-10-11 ENCOUNTER — Other Ambulatory Visit: Payer: Self-pay

## 2021-10-11 ENCOUNTER — Ambulatory Visit
Admission: RE | Admit: 2021-10-11 | Discharge: 2021-10-11 | Disposition: A | Discharge status: Home / Self Care | Payer: No Typology Code available for payment source | Source: Ambulatory Visit | Attending: Internal Medicine | Admitting: Internal Medicine

## 2021-10-11 DIAGNOSIS — R600 Localized edema: Secondary | ICD-10-CM | POA: Diagnosis present

## 2021-10-11 DIAGNOSIS — I824Z3 Acute embolism and thrombosis of unspecified deep veins of distal lower extremity, bilateral: Secondary | ICD-10-CM | POA: Insufficient documentation

## 2022-02-25 ENCOUNTER — Other Ambulatory Visit: Payer: Self-pay | Admitting: Family Medicine

## 2022-02-25 DIAGNOSIS — R1013 Epigastric pain: Secondary | ICD-10-CM

## 2022-02-26 ENCOUNTER — Ambulatory Visit
Admission: RE | Admit: 2022-02-26 | Discharge: 2022-02-26 | Disposition: A | Discharge status: Home / Self Care | Payer: No Typology Code available for payment source | Source: Ambulatory Visit | Attending: Family Medicine | Admitting: Family Medicine

## 2022-02-26 DIAGNOSIS — R1013 Epigastric pain: Secondary | ICD-10-CM | POA: Diagnosis present
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
## 2022-09-30 ENCOUNTER — Other Ambulatory Visit (HOSPITAL_COMMUNITY): Payer: Self-pay | Admitting: Family Medicine

## 2022-09-30 DIAGNOSIS — R1013 Epigastric pain: Secondary | ICD-10-CM

## 2022-09-30 DIAGNOSIS — R110 Nausea: Secondary | ICD-10-CM

## 2022-09-30 DIAGNOSIS — R195 Other fecal abnormalities: Secondary | ICD-10-CM

## 2022-11-18 ENCOUNTER — Other Ambulatory Visit: Payer: Self-pay | Admitting: Neurology

## 2022-11-18 DIAGNOSIS — G43719 Chronic migraine without aura, intractable, without status migrainosus: Secondary | ICD-10-CM

## 2022-12-23 ENCOUNTER — Other Ambulatory Visit: Payer: Self-pay | Admitting: Gastroenterology

## 2022-12-23 DIAGNOSIS — R6881 Early satiety: Secondary | ICD-10-CM

## 2023-01-02 ENCOUNTER — Other Ambulatory Visit: Payer: Self-pay

## 2023-01-02 ENCOUNTER — Encounter (HOSPITAL_COMMUNITY): Payer: Self-pay | Admitting: *Deleted

## 2023-01-02 NOTE — Progress Notes (Signed)
I spoke to Mike Cooley, Mike Cooley's wife and designated person to speak with. Mr.Bouton is in the room also.  Mrs. Arritt reports that Mr. Brunsvold has not complained of chest pain  or shortness of breath. Mrs. Chernesky denies having any s/s of Covid in her household, also denies any known exposure to Covid.  Mrs Komisar reports that patient has not has any s/s of upper or lower respiratory infections, in the past 2 months.  Mr. Migdalia Dk has been seen at [facility], but now is seen by PCP and Neurologist  at [facility]  Patient does receive some medications from [place].

## 2023-01-06 ENCOUNTER — Ambulatory Visit (HOSPITAL_COMMUNITY)
Admission: RE | Admit: 2023-01-06 | Discharge: 2023-01-06 | Disposition: A | Discharge status: Home / Self Care | Payer: No Typology Code available for payment source | Source: Ambulatory Visit | Attending: Neurology | Admitting: Neurology

## 2023-01-06 ENCOUNTER — Other Ambulatory Visit: Payer: Self-pay

## 2023-01-06 ENCOUNTER — Ambulatory Visit (HOSPITAL_BASED_OUTPATIENT_CLINIC_OR_DEPARTMENT_OTHER): Payer: No Typology Code available for payment source | Admitting: Certified Registered Nurse Anesthetist

## 2023-01-06 ENCOUNTER — Ambulatory Visit (HOSPITAL_COMMUNITY): Payer: No Typology Code available for payment source | Admitting: Certified Registered Nurse Anesthetist

## 2023-01-06 ENCOUNTER — Encounter (HOSPITAL_COMMUNITY): Admission: RE | Disposition: A | Discharge status: Home / Self Care | Payer: Self-pay | Source: Ambulatory Visit

## 2023-01-06 ENCOUNTER — Encounter (HOSPITAL_COMMUNITY): Payer: Self-pay

## 2023-01-06 DIAGNOSIS — Z87891 Personal history of nicotine dependence: Secondary | ICD-10-CM | POA: Insufficient documentation

## 2023-01-06 DIAGNOSIS — Z79899 Other long term (current) drug therapy: Secondary | ICD-10-CM | POA: Diagnosis not present

## 2023-01-06 DIAGNOSIS — Z6841 Body Mass Index (BMI) 40.0 and over, adult: Secondary | ICD-10-CM | POA: Diagnosis not present

## 2023-01-06 DIAGNOSIS — G43719 Chronic migraine without aura, intractable, without status migrainosus: Secondary | ICD-10-CM

## 2023-01-06 DIAGNOSIS — I10 Essential (primary) hypertension: Secondary | ICD-10-CM | POA: Insufficient documentation

## 2023-01-06 DIAGNOSIS — G43909 Migraine, unspecified, not intractable, without status migrainosus: Secondary | ICD-10-CM | POA: Diagnosis present

## 2023-01-06 DIAGNOSIS — G43019 Migraine without aura, intractable, without status migrainosus: Secondary | ICD-10-CM | POA: Diagnosis not present

## 2023-01-06 DIAGNOSIS — F419 Anxiety disorder, unspecified: Secondary | ICD-10-CM | POA: Diagnosis not present

## 2023-01-06 HISTORY — DX: Unspecified osteoarthritis, unspecified site: M19.90

## 2023-01-06 HISTORY — DX: Claustrophobia: F40.240

## 2023-01-06 HISTORY — DX: Essential (primary) hypertension: I10

## 2023-01-06 HISTORY — PX: RADIOLOGY WITH ANESTHESIA: SHX6223

## 2023-01-06 LAB — BASIC METABOLIC PANEL
Anion gap: 11 (ref 5–15)
BUN: 22 mg/dL — ABNORMAL HIGH (ref 6–20)
CO2: 23 mmol/L (ref 22–32)
Calcium: 9.8 mg/dL (ref 8.9–10.3)
Chloride: 103 mmol/L (ref 98–111)
Creatinine, Ser: 1.11 mg/dL (ref 0.61–1.24)
GFR, Estimated: >60 mL/min (ref >60)
Glucose, Bld: 118 mg/dL — ABNORMAL HIGH (ref 70–99)
Potassium: 4.2 mmol/L (ref 3.5–5.1)
Sodium: 137 mmol/L (ref 135–145)

## 2023-01-06 SURGERY — MRI WITH ANESTHESIA
Anesthesia: General

## 2023-01-06 MED ORDER — LIDOCAINE 2% (20 MG/ML) 5 ML SYRINGE
INTRAMUSCULAR | Status: DC | PRN
  Administered 2023-01-06: 100 mg via INTRAVENOUS

## 2023-01-06 MED ORDER — LACTATED RINGERS IV SOLN: INTRAVENOUS | Status: DC

## 2023-01-06 MED ORDER — GADOBUTROL 1 MMOL/ML IV SOLN
10.0000 mL | Freq: Once | INTRAVENOUS | Status: AC | PRN
  Administered 2023-01-06: 10 mL via INTRAVENOUS

## 2023-01-06 MED ORDER — ORAL CARE MOUTH RINSE: 15.0000 mL | Freq: Once | OROMUCOSAL | Status: AC

## 2023-01-06 MED ORDER — PROPOFOL 10 MG/ML IV BOLUS
INTRAVENOUS | Status: DC | PRN
  Administered 2023-01-06: 200 mg via INTRAVENOUS

## 2023-01-06 MED ORDER — MIDAZOLAM HCL 2 MG/2ML IJ SOLN
INTRAMUSCULAR | Status: DC | PRN
  Administered 2023-01-06 (×2): 2 mg via INTRAVENOUS

## 2023-01-06 MED ORDER — CHLORHEXIDINE GLUCONATE 0.12 % MT SOLN
15.0000 mL | Freq: Once | OROMUCOSAL | Status: AC
  Administered 2023-01-06: 15 mL via OROMUCOSAL
  Filled 2023-01-06: qty 15

## 2023-01-06 MED ORDER — ONDANSETRON HCL 4 MG/2ML IJ SOLN
INTRAMUSCULAR | Status: DC | PRN
  Administered 2023-01-06: 4 mg via INTRAVENOUS

## 2023-01-06 MED ORDER — EPHEDRINE SULFATE-NACL 50-0.9 MG/10ML-% IV SOSY
PREFILLED_SYRINGE | INTRAVENOUS | Status: DC | PRN
  Administered 2023-01-06: 5 mg via INTRAVENOUS
  Administered 2023-01-06: 10 mg via INTRAVENOUS

## 2023-01-06 MED ORDER — ORAL CARE MOUTH RINSE: 15.0000 mL | Freq: Once | OROMUCOSAL | Status: DC

## 2023-01-06 MED ORDER — CHLORHEXIDINE GLUCONATE 0.12 % MT SOLN: 15.0000 mL | Freq: Once | OROMUCOSAL | Status: DC

## 2023-01-06 MED ORDER — DEXAMETHASONE SODIUM PHOSPHATE 10 MG/ML IJ SOLN
INTRAMUSCULAR | Status: DC | PRN
  Administered 2023-01-06: 10 mg via INTRAVENOUS

## 2023-01-06 MED ORDER — MIDAZOLAM HCL 2 MG/2ML IJ SOLN
INTRAMUSCULAR | Status: AC
  Filled 2023-01-06: qty 2

## 2023-01-06 MED ORDER — DEXMEDETOMIDINE HCL IN NACL 80 MCG/20ML IV SOLN
INTRAVENOUS | Status: DC | PRN
  Administered 2023-01-06: 8 ug via BUCCAL

## 2023-01-06 MED ORDER — PHENYLEPHRINE 80 MCG/ML (10ML) SYRINGE FOR IV PUSH (FOR BLOOD PRESSURE SUPPORT)
PREFILLED_SYRINGE | INTRAVENOUS | Status: DC | PRN
  Administered 2023-01-06: 80 ug via INTRAVENOUS
  Administered 2023-01-06 (×2): 120 ug via INTRAVENOUS
  Administered 2023-01-06: 160 ug via INTRAVENOUS
  Administered 2023-01-06 (×3): 120 ug via INTRAVENOUS

## 2023-01-06 NOTE — Transfer of Care (Signed)
Immediate Anesthesia Transfer of Care Note  Patient: Mike Cooley  Procedure(s) Performed: MRI BRAIN WITH AND WITHOUT CONTRAST  Patient Location: PACU  Anesthesia Type:General  Level of Consciousness: awake, oriented, patient cooperative, and responds to stimulation  Airway & Oxygen Therapy: Patient Spontanous Breathing  Post-op Assessment: Report given to RN and Post -op Vital signs reviewed and stable  Post vital signs: Reviewed and stable  Last Vitals:  Vitals Value Taken Time  BP 129/95 01/06/23 1055  Temp 36.5 C 01/06/23 1055  Pulse 73 01/06/23 1058  Resp 16 01/06/23 1058  SpO2 93 % 01/06/23 1058  Vitals shown include unvalidated device data.  Last Pain:  Vitals:   01/06/23 1055  TempSrc:   PainSc: 0-No pain         Complications: No notable events documented.
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
## 2023-01-07 ENCOUNTER — Encounter (HOSPITAL_COMMUNITY): Payer: Self-pay | Admitting: Radiology

## 2023-01-07 LAB — GLUCOSE, CAPILLARY: Glucose-Capillary: 211 mg/dL — ABNORMAL HIGH (ref 70–99)

## 2023-01-07 NOTE — Anesthesia Postprocedure Evaluation (Signed)
Anesthesia Post Note  Patient: Mike Cooley  Procedure(s) Performed: MRI BRAIN WITH AND WITHOUT CONTRAST     Patient location during evaluation: PACU Anesthesia Type: General Level of consciousness: awake and alert Pain management: pain level controlled Vital Signs Assessment: post-procedure vital signs reviewed and stable Respiratory status: spontaneous breathing, nonlabored ventilation, respiratory function stable and patient connected to nasal cannula oxygen Cardiovascular status: blood pressure returned to baseline and stable Postop Assessment: no apparent nausea or vomiting Anesthetic complications: no  No notable events documented.  Last Vitals:  Vitals:   01/06/23 1110 01/06/23 1125  BP: 130/77 121/87  Pulse: 74 75  Resp: 16 20  Temp:  36.5 C  SpO2: 93% 94%    Last Pain:  Vitals:   01/06/23 1055  TempSrc:   PainSc: 0-No pain                 Maxene Byington L Murrell D[place]

## 2023-01-16 ENCOUNTER — Encounter
Admission: RE | Admit: 2023-01-16 | Discharge: 2023-01-16 | Disposition: A | Discharge status: Home / Self Care | Payer: No Typology Code available for payment source | Source: Ambulatory Visit | Attending: Gastroenterology | Admitting: Gastroenterology

## 2023-01-16 DIAGNOSIS — R6881 Early satiety: Secondary | ICD-10-CM | POA: Insufficient documentation

## 2023-01-16 MED ORDER — TECHNETIUM TC 99M SULFUR COLLOID
2.1000 | Freq: Once | INTRAVENOUS | Status: AC | PRN
  Administered 2023-01-16: 2.15 via ORAL

## 2023-03-27 ENCOUNTER — Encounter: Payer: Self-pay | Admitting: Gastroenterology

## 2023-04-02 ENCOUNTER — Encounter: Payer: Self-pay | Admitting: Gastroenterology

## 2023-04-02 NOTE — H&P (Signed)
Pre-Procedure H&P   Patient ID: Mike Cooley is a 52 y.o. male.  Gastroenterology Provider: Jaynie Collins, DO  PCP: Wilford Corner, PA-C  Date: 04/03/2023  HPI Mr. Mike Cooley is a 52 y.o. male who presents today for Colonoscopy for Surveillance-personal history of colon polyps .  Patient with a personal history of colon polyps undergoing colonoscopy in July 2017 demonstrating 3 tubular adenomas and 1 sessile serrated polyp.  Biopsies were negative for microscopic colitis at that time.  Diverticulosis was also demonstrated. The patient has had ongoing epigastric discomfort nausea and loose bowels for multiple years.  He notes early satiety and reflux.  The pain goes away with bowel movements.  He denies any melena or hematochezia.  Patient symptoms improved with avoiding alcohol tobacco and gluten.  When he has spells of diarrhea he can have upwards of 5 bowel movements a day for a few weeks.  He then will otherwise return to normal.  Status post appendectomy  Underwent EGD in 2017 as well demonstrated hiatal hernia.  Also underwent EGD in 2021 negative for celiac disease  2021 CT demonstrated diverticulosis.  03/15/2022 right upper quadrant ultrasound demonstrated fatty liver disease Elevated LFTs creatinine 1.11 hemoglobin 16.4 MCV 95 platelets 249,000   Past Medical History:  Diagnosis Date   Abdominal pain    Arthritis    Biceps tendinitis    Claustrophobia    Deep vein thrombosis (DVT) of distal vein of left lower extremity (HCC)    Depression    Dyspnea    Embolism and thrombosis of artery (HCC)    GERD (gastroesophageal reflux disease)    Hypertension    Insomnia    Irritable bowel syndrome    Migraines    Morbid obesity (HCC)    Psoriasis    Psoriatic arthritis (HCC)    PTSD (post-traumatic stress disorder)    Seizures (HCC)    Shoulder region pain    Sleep apnea    Tinnitus     Past Surgical History:  Procedure Laterality  Date   APPENDECTOMY     LASIK     RADIOLOGY WITH ANESTHESIA N/A 01/06/2023   Procedure: MRI BRAIN WITH AND WITHOUT CONTRAST;  Surgeon: Radiologist, Medication, MD;  Location: [facility] OR;  Service: Radiology;  Laterality: N/A;    Family History No h/o GI disease or malignancy  Review of Systems  Constitutional:  Negative for activity change, appetite change, chills, diaphoresis, fatigue, fever and unexpected weight change.  HENT:  Negative for trouble swallowing and voice change.   Respiratory:  Negative for shortness of breath and wheezing.   Cardiovascular:  Negative for chest pain, palpitations and leg swelling.  Gastrointestinal:  Positive for abdominal pain and diarrhea. Negative for abdominal distention, anal bleeding, blood in stool, constipation, nausea, rectal pain and vomiting.  Musculoskeletal:  Negative for arthralgias and myalgias.  Skin:  Negative for color change and pallor.  Neurological:  Negative for dizziness, syncope and weakness.  Psychiatric/Behavioral:  Negative for confusion. The patient is not nervous/anxious.   All other systems reviewed and are negative.    Medications No current facility-administered medications on file prior to encounter.   Current Outpatient Medications on File Prior to Encounter  Medication Sig Dispense Refill   Calcium Carb-Cholecalciferol (CALCIUM 500 + D PO) Take 1 tablet by mouth daily.     calcium elemental as carbonate (TUMS ULTRA 1000) 400 MG chewable tablet Chew 2,000 mg by mouth daily as needed for heartburn.  cholecalciferol (VITAMIN D3) 10 MCG (400 UNIT) TABS tablet Take 1,000 Units by mouth daily.     ergocalciferol (VITAMIN D2) 1.25 MG (50000 UT) capsule Take 50,000 Units by mouth once a week.     metoprolol succinate (TOPROL-XL) 25 MG 24 hr tablet Take 25 mg by mouth daily.     omeprazole (PRILOSEC) 20 MG capsule Take 40 mg by mouth 2 (two) times daily before a meal.     pantoprazole (PROTONIX) 40 MG tablet Take 40 mg by  mouth daily before breakfast.     Secukinumab (COSENTYX) 150 MG/ML SOSY Inject 1 Dose into the skin every 28 (twenty-eight) days.     SUMAtriptan (IMITREX) 100 MG tablet Take 100 mg by mouth every 2 (two) hours as needed for migraine. May repeat in 2 hours if headache persists or recurs.     topiramate (TOPAMAX) 100 MG tablet Take 50 mg by mouth at bedtime.     vitamin B-12 (CYANOCOBALAMIN) 500 MCG tablet Take 500 mcg by mouth daily.     acetaminophen (TYLENOL) 500 MG tablet Take 1,000 mg by mouth every 6 (six) hours as needed for moderate pain.      Pertinent medications related to GI and procedure were reviewed by me with the patient prior to the procedure   Current Facility-Administered Medications:    0.9 %  sodium chloride infusion, , Intravenous, Continuous, Jaynie Collins, DO  sodium chloride         Allergies  Allergen Reactions   Gluten Meal Diarrhea    Belly pain   Allergies were reviewed by me prior to the procedure  Objective   Body mass index is 41.15 kg/m. Vitals:   04/03/23 0817  BP: (!) 142/51  Pulse: 75  Resp: 18  Temp: 97.8 F (36.6 C)  TempSrc: Temporal  SpO2: 97%  Weight: 130.1 kg  Height: 5\' 10"  (1.778 m)     Physical Exam Vitals and nursing note reviewed.  Constitutional:      General: He is not in acute distress.    Appearance: Normal appearance. He is obese. He is not ill-appearing, toxic-appearing or diaphoretic.  HENT:     Head: Normocephalic and atraumatic.     Nose: Nose normal.     Mouth/Throat:     Mouth: Mucous membranes are moist.     Pharynx: Oropharynx is clear.  Eyes:     General: No scleral icterus.    Extraocular Movements: Extraocular movements intact.  Cardiovascular:     Rate and Rhythm: Normal rate and regular rhythm.     Heart sounds: Normal heart sounds. No murmur heard.    No friction rub. No gallop.  Pulmonary:     Effort: Pulmonary effort is normal. No respiratory distress.     Breath sounds: Normal  breath sounds. No wheezing, rhonchi or rales.  Abdominal:     General: Bowel sounds are normal. There is no distension.     Palpations: Abdomen is soft.     Tenderness: There is no abdominal tenderness. There is no guarding or rebound.  Musculoskeletal:     Cervical back: Neck supple.     Right lower leg: No edema.     Left lower leg: No edema.  Skin:    General: Skin is warm and dry.     Coloration: Skin is not jaundiced or pale.  Neurological:     General: No focal deficit present.     Mental Status: He is alert and oriented to person, place,  and time. Mental status is at baseline.  Psychiatric:        Mood and Affect: Mood normal.        Behavior: Behavior normal.        Thought Content: Thought content normal.        Judgment: Judgment normal.      Assessment:  Mr. Mike Cooley is a 52 y.o. male  who presents today for Colonoscopy for Surveillance-personal history of colon polyps .  Plan:  Colonoscopy with possible intervention today  Colonoscopy with possible biopsy, control of bleeding, polypectomy, and interventions as necessary has been discussed with the patient/patient representative. Informed consent was obtained from the patient/patient representative after explaining the indication, nature, and risks of the procedure including but not limited to death, bleeding, perforation, missed neoplasm/lesions, cardiorespiratory compromise, and reaction to medications. Opportunity for questions was given and appropriate answers were provided. Patient/patient representative has verbalized understanding is amenable to undergoing the procedure.   Jaynie Collins, DO  [facility]  Portions of the record may have been created with voice recognition software. Occasional wrong-word or 'sound-a-like' substitutions may have occurred due to the inherent limitations of voice recognition software.  Read the chart carefully and recognize, using context, where  substitutions may have occurred.

## 2023-04-03 ENCOUNTER — Encounter: Payer: Self-pay | Admitting: Gastroenterology

## 2023-04-03 ENCOUNTER — Ambulatory Visit: Payer: No Typology Code available for payment source | Admitting: Anesthesiology

## 2023-04-03 ENCOUNTER — Ambulatory Visit
Admission: RE | Admit: 2023-04-03 | Discharge: 2023-04-03 | Disposition: A | Discharge status: Home / Self Care | Payer: No Typology Code available for payment source | Attending: Gastroenterology | Admitting: Gastroenterology

## 2023-04-03 ENCOUNTER — Encounter
Admission: RE | Disposition: A | Discharge status: Home / Self Care | Payer: Self-pay | Source: Home / Self Care | Attending: Gastroenterology

## 2023-04-03 DIAGNOSIS — K5730 Diverticulosis of large intestine without perforation or abscess without bleeding: Secondary | ICD-10-CM | POA: Diagnosis not present

## 2023-04-03 DIAGNOSIS — K219 Gastro-esophageal reflux disease without esophagitis: Secondary | ICD-10-CM | POA: Diagnosis not present

## 2023-04-03 DIAGNOSIS — Z1211 Encounter for screening for malignant neoplasm of colon: Secondary | ICD-10-CM | POA: Diagnosis not present

## 2023-04-03 DIAGNOSIS — K760 Fatty (change of) liver, not elsewhere classified: Secondary | ICD-10-CM | POA: Diagnosis not present

## 2023-04-03 DIAGNOSIS — F419 Anxiety disorder, unspecified: Secondary | ICD-10-CM | POA: Insufficient documentation

## 2023-04-03 DIAGNOSIS — Z6841 Body Mass Index (BMI) 40.0 and over, adult: Secondary | ICD-10-CM | POA: Diagnosis not present

## 2023-04-03 DIAGNOSIS — Z79899 Other long term (current) drug therapy: Secondary | ICD-10-CM | POA: Insufficient documentation

## 2023-04-03 DIAGNOSIS — L4050 Arthropathic psoriasis, unspecified: Secondary | ICD-10-CM | POA: Insufficient documentation

## 2023-04-03 DIAGNOSIS — F4310 Post-traumatic stress disorder, unspecified: Secondary | ICD-10-CM | POA: Diagnosis not present

## 2023-04-03 DIAGNOSIS — K640 First degree hemorrhoids: Secondary | ICD-10-CM | POA: Insufficient documentation

## 2023-04-03 DIAGNOSIS — Z86718 Personal history of other venous thrombosis and embolism: Secondary | ICD-10-CM | POA: Diagnosis not present

## 2023-04-03 DIAGNOSIS — Z79891 Long term (current) use of opiate analgesic: Secondary | ICD-10-CM | POA: Insufficient documentation

## 2023-04-03 DIAGNOSIS — G40909 Epilepsy, unspecified, not intractable, without status epilepticus: Secondary | ICD-10-CM | POA: Insufficient documentation

## 2023-04-03 DIAGNOSIS — F32A Depression, unspecified: Secondary | ICD-10-CM | POA: Diagnosis not present

## 2023-04-03 DIAGNOSIS — M1990 Unspecified osteoarthritis, unspecified site: Secondary | ICD-10-CM | POA: Insufficient documentation

## 2023-04-03 DIAGNOSIS — Z86010 Personal history of colonic polyps: Secondary | ICD-10-CM | POA: Diagnosis not present

## 2023-04-03 DIAGNOSIS — K635 Polyp of colon: Secondary | ICD-10-CM | POA: Diagnosis not present

## 2023-04-03 DIAGNOSIS — K589 Irritable bowel syndrome without diarrhea: Secondary | ICD-10-CM | POA: Diagnosis not present

## 2023-04-03 DIAGNOSIS — G4730 Sleep apnea, unspecified: Secondary | ICD-10-CM | POA: Insufficient documentation

## 2023-04-03 DIAGNOSIS — I10 Essential (primary) hypertension: Secondary | ICD-10-CM | POA: Diagnosis not present

## 2023-04-03 HISTORY — DX: Bicipital tendinitis, unspecified shoulder: M75.20

## 2023-04-03 HISTORY — DX: Dyspnea, unspecified: R06.00

## 2023-04-03 HISTORY — DX: Psoriasis, unspecified: L40.9

## 2023-04-03 HISTORY — DX: Unspecified convulsions: R56.9

## 2023-04-03 HISTORY — DX: Embolism and thrombosis of unspecified artery: I74.9

## 2023-04-03 HISTORY — DX: Sleep apnea, unspecified: G47.30

## 2023-04-03 HISTORY — DX: Insomnia, unspecified: G47.00

## 2023-04-03 HISTORY — DX: Pain in unspecified shoulder: M25.519

## 2023-04-03 HISTORY — DX: Irritable bowel syndrome, unspecified: K58.9

## 2023-04-03 HISTORY — DX: Acute embolism and thrombosis of unspecified deep veins of left distal lower extremity: I82.4Z2

## 2023-04-03 HISTORY — DX: Depression, unspecified: F32.A

## 2023-04-03 HISTORY — DX: Morbid (severe) obesity due to excess calories: E66.01

## 2023-04-03 HISTORY — DX: Arthropathic psoriasis, unspecified: L40.50

## 2023-04-03 HISTORY — PX: COLONOSCOPY: SHX5424

## 2023-04-03 SURGERY — COLONOSCOPY
Anesthesia: General

## 2023-04-03 MED ORDER — DEXMEDETOMIDINE HCL IN NACL 200 MCG/50ML IV SOLN
INTRAVENOUS | Status: DC | PRN
  Administered 2023-04-03 (×2): 12 ug via INTRAVENOUS

## 2023-04-03 MED ORDER — PROPOFOL 500 MG/50ML IV EMUL
INTRAVENOUS | Status: DC | PRN
  Administered 2023-04-03: 165 ug/kg/min via INTRAVENOUS

## 2023-04-03 MED ORDER — MIDAZOLAM HCL 2 MG/2ML IJ SOLN
INTRAMUSCULAR | Status: DC | PRN
  Administered 2023-04-03: 2 mg via INTRAVENOUS

## 2023-04-03 MED ORDER — PROPOFOL 10 MG/ML IV BOLUS
INTRAVENOUS | Status: DC | PRN
  Administered 2023-04-03: 70 mg via INTRAVENOUS
  Administered 2023-04-03: 30 mg via INTRAVENOUS

## 2023-04-03 MED ORDER — LIDOCAINE HCL (CARDIAC) PF 100 MG/5ML IV SOSY
PREFILLED_SYRINGE | INTRAVENOUS | Status: DC | PRN
  Administered 2023-04-03: 100 mg via INTRAVENOUS

## 2023-04-03 MED ORDER — SODIUM CHLORIDE 0.9 % IV SOLN: INTRAVENOUS | Status: DC

## 2023-04-03 MED ORDER — MIDAZOLAM HCL 2 MG/2ML IJ SOLN
INTRAMUSCULAR | Status: AC
  Filled 2023-04-03: qty 2

## 2023-04-03 NOTE — Interval H&P Note (Signed)
History and Physical Interval Note: Preprocedure H&P from 04/03/23  was reviewed and there was no interval change after seeing and examining the patient.  Written consent was obtained from the patient after discussion of risks, benefits, and alternatives. Patient has consented to proceed with Colonoscopy with possible intervention   04/03/2023 8:19 AM  Wynona Canes  has presented today for surgery, with the diagnosis of Adenomatous polyp of colon, unspecified part of colon (D12.6).  The various methods of treatment have been discussed with the patient and family. After consideration of risks, benefits and other options for treatment, the patient has consented to  Procedure(s): COLONOSCOPY (N/A) as a surgical intervention.  The patient's history has been reviewed, patient examined, no change in status, stable for surgery.  I have reviewed the patient's chart and labs.  Questions were answered to the patient's satisfaction.     Jaynie Collins

## 2023-04-03 NOTE — Op Note (Signed)
[facility] Patient Name: Mike Cooley Procedure Date: 04/03/2023 8:09 AM MRN: [id number] Account #: [id number] Date of Birth: 02-26-1971 Admit Type: Outpatient Age: 52 Room: [facility] ENDO ROOM 1 Gender: Male Note Status: Finalized Instrument Name: Colonscope [id number] Procedure:             Colonoscopy Indications:           High risk colon cancer surveillance: Personal history                         of colonic polyps Providers:             Jaynie Collins DO, DO Medicines:             Monitored Anesthesia Care Complications:         No immediate complications. Estimated blood loss:                         Minimal. Procedure:             Pre-Anesthesia Assessment:                        - Prior to the procedure, a History and Physical was                         performed, and patient medications and allergies were                         reviewed. The patient is competent. The risks and                         benefits of the procedure and the sedation options and                         risks were discussed with the patient. All questions                         were answered and informed consent was obtained.                         Patient identification and proposed procedure were                         verified by the physician, the nurse, the anesthetist                         and the technician in the endoscopy suite. Mental                         Status Examination: alert and oriented. Airway                         Examination: normal oropharyngeal airway and neck                         mobility. Respiratory Examination: clear to                         auscultation. CV Examination: RRR, no murmurs, no S3  or S4. Prophylactic Antibiotics: The patient does not                         require prophylactic antibiotics. Prior                         Anticoagulants: The patient has taken no anticoagulant                          or antiplatelet agents. ASA Grade Assessment: III - A                         patient with severe systemic disease. After reviewing                         the risks and benefits, the patient was deemed in                         satisfactory condition to undergo the procedure. The                         anesthesia plan was to use monitored anesthesia care                         (MAC). Immediately prior to administration of                         medications, the patient was re-assessed for adequacy                         to receive sedatives. The heart rate, respiratory                         rate, oxygen saturations, blood pressure, adequacy of                         pulmonary ventilation, and response to care were                         monitored throughout the procedure. The physical                         status of the patient was re-assessed after the                         procedure.                        After obtaining informed consent, the colonoscope was                         passed under direct vision. Throughout the procedure,                         the patient's blood pressure, pulse, and oxygen                         saturations were monitored continuously. The  Colonoscope was introduced through the anus and                         advanced to the the cecum, identified by appendiceal                         orifice and ileocecal valve. The colonoscopy was                         somewhat difficult due to the patient's body habitus.                         Successful completion of the procedure was aided by                         straightening and shortening the scope to obtain bowel                         loop reduction and using scope torsion. The patient                         tolerated the procedure well. The quality of the bowel                         preparation was evaluated using the BBPS [facility] Bowel                          Preparation Scale) with scores of: Right Colon = 3                         (entire mucosa seen well with no residual staining,                         small fragments of stool or opaque liquid), Transverse                         Colon = 3 (entire mucosa seen well with no residual                         staining, small fragments of stool or opaque liquid)                         and Left Colon = 2 (minor amount of residual staining,                         small fragments of stool and/or opaque liquid, but                         mucosa seen well). The total BBPS score equals 8. The                         quality of the bowel preparation was excellent. The                         ileocecal valve, appendiceal orifice, and rectum were  photographed. Findings:      The perianal and digital rectal examinations were normal. Pertinent       negatives include normal sphincter tone.      Multiple small-mouthed diverticula were found in the left colon.       Estimated blood loss: none.      Non-bleeding internal hemorrhoids were found during retroflexion. The       hemorrhoids were Grade I (internal hemorrhoids that do not prolapse).       Estimated blood loss: none.      Five sessile polyps were found in the descending colon. The polyps were       1 to 2 mm in size. These polyps were removed with a jumbo cold forceps.       Resection and retrieval were complete. Estimated blood loss was minimal.      Two sessile polyps were found in the transverse colon and ascending       colon. The polyps were 2 to 5 mm in size. These polyps were removed with       a cold snare. Resection and retrieval were complete. Estimated blood       loss was minimal.      Anal papilla(e) were hypertrophied. Estimated blood loss: none.      The exam was otherwise without abnormality on direct and retroflexion       views. Impression:            - Diverticulosis in the left colon.                         - Non-bleeding internal hemorrhoids.                        - Five 1 to 2 mm polyps in the descending colon,                         removed with a jumbo cold forceps. Resected and                         retrieved.                        - Two 2 to 5 mm polyps in the transverse colon and in                         the ascending colon, removed with a cold snare.                         Resected and retrieved.                        - Anal papilla(e) were hypertrophied.                        - The examination was otherwise normal on direct and                         retroflexion views. Recommendation:        - Patient has a contact number available for                         emergencies.  The signs and symptoms of potential                         delayed complications were discussed with the patient.                         Return to normal activities tomorrow. Written                         discharge instructions were provided to the patient.                        - Discharge patient to home.                        - Resume previous diet.                        - Continue present medications.                        - No ibuprofen, naproxen, or other non-steroidal                         anti-inflammatory drugs for 5 days after polyp removal.                        - Await pathology results.                        - Repeat colonoscopy for surveillance based on                         pathology results.                        - Return to GI office as previously scheduled.                        - The findings and recommendations were discussed with                         the patient. Procedure Code(s):     --- Professional ---                        [phone], Colonoscopy, flexible; with removal of                         tumor(s), polyp(s), or other lesion(s) by snare                         technique                        45380, 59, Colonoscopy, flexible; with  biopsy, single                         or multiple Diagnosis Code(s):     --- Professional ---                        Z86.010, Personal history of  colonic polyps                        K64.0, First degree hemorrhoids                        D12.4, Benign neoplasm of descending colon                        D12.3, Benign neoplasm of transverse colon (hepatic                         flexure or splenic flexure)                        D12.2, Benign neoplasm of ascending colon                        K62.89, Other specified diseases of anus and rectum                        K57.30, Diverticulosis of large intestine without                         perforation or abscess without bleeding CPT copyright 2022 American [employer] Association. All rights reserved. The codes documented in this report are preliminary and upon coder review may  be revised to meet current compliance requirements. Attending Participation:      I personally performed the entire procedure. Elfredia Nevins, DO Jaynie Collins DO, DO 04/03/2023 9:00:34 AM This report has been signed electronically. Number of Addenda: 0 Note Initiated On: 04/03/2023 8:09 AM Scope Withdrawal Time: 0 hours 18 minutes 10 seconds  Total Procedure Duration: 0 hours 21 minutes 55 seconds  Estimated Blood Loss:  Estimated blood loss was minimal.      [facility]

## 2023-04-03 NOTE — Anesthesia Postprocedure Evaluation (Signed)
Anesthesia Post Note  Patient: Mike Cooley  Procedure(s) Performed: COLONOSCOPY  Patient location during evaluation: PACU Anesthesia Type: General Level of consciousness: awake and alert, oriented and patient cooperative Pain management: pain level controlled Vital Signs Assessment: post-procedure vital signs reviewed and stable Respiratory status: spontaneous breathing, nonlabored ventilation and respiratory function stable Cardiovascular status: blood pressure returned to baseline and stable Postop Assessment: adequate PO intake Anesthetic complications: no   No notable events documented.   Last Vitals:  Vitals:   04/03/23 0929 04/03/23 0939  BP: 105/81 (!) 114/92  Pulse: (!) 53 (!) 58  Resp: 14 18  Temp:    SpO2: 95% 95%    Last Pain:  Vitals:   04/03/23 0929  TempSrc:   PainSc: 0-No pain                 Reed Breech

## 2023-04-03 NOTE — Anesthesia Preprocedure Evaluation (Addendum)
Anesthesia Evaluation  Patient identified by MRN, date of birth, ID band Patient awake    Reviewed: Allergy & Precautions, NPO status , Patient's Chart, lab work & pertinent test results  History of Anesthesia Complications Negative for: history of anesthetic complications  Airway Mallampati: IV   Neck ROM: Full    Dental no notable dental hx.    Pulmonary sleep apnea , former smoker (quit 1998)   Pulmonary exam normal breath sounds clear to auscultation       Cardiovascular hypertension, Normal cardiovascular exam Rhythm:Regular Rate:Normal  Hx DVT  Echo 09/02/21:  NORMAL LEFT VENTRICULAR SYSTOLIC FUNCTION  NORMAL RIGHT VENTRICULAR SYSTOLIC FUNCTION  NO VALVULAR STENOSIS  TRIVIAL MR, TR  EF >55%     Neuro/Psych  Headaches PSYCHIATRIC DISORDERS (PTSD) Anxiety Depression       GI/Hepatic ,GERD  ,,  Endo/Other  Class 3 obesity  Renal/GU negative Renal ROS     Musculoskeletal  (+) Arthritis ,    Abdominal   Peds  Hematology negative hematology ROS (+)   Anesthesia Other Findings   Reproductive/Obstetrics                             Anesthesia Physical Anesthesia Plan  ASA: 3  Anesthesia Plan: General   Post-op Pain Management:    Induction: Intravenous  PONV Risk Score and Plan: 2 and Propofol infusion, TIVA and Treatment may vary due to age or medical condition  Airway Management Planned: Natural Airway  Additional Equipment:   Intra-op Plan:   Post-operative Plan:   Informed Consent: I have reviewed the patients History and Physical, chart, labs and discussed the procedure including the risks, benefits and alternatives for the proposed anesthesia with the patient or authorized representative who has indicated his/her understanding and acceptance.       Plan Discussed with: CRNA  Anesthesia Plan Comments: (LMA/GETA backup discussed.  Patient consented for risks of  anesthesia including but not limited to:  - adverse reactions to medications - damage to eyes, teeth, lips or other oral mucosa - nerve damage due to positioning  - sore throat or hoarseness - damage to heart, brain, nerves, lungs, other parts of body or loss of life  Informed patient about role of CRNA in peri- and intra-operative care.  Patient voiced understanding.)        Anesthesia Quick Evaluation

## 2023-04-03 NOTE — Transfer of Care (Signed)
Immediate Anesthesia Transfer of Care Note  Patient: Mike Cooley  Procedure(s) Performed: COLONOSCOPY  Patient Location: Endoscopy Unit  Anesthesia Type:General  Level of Consciousness: drowsy and patient cooperative  Airway & Oxygen Therapy: Patient Spontanous Breathing and Patient connected to face mask oxygen  Post-op Assessment: Report given to RN and Post -op Vital signs reviewed and stable  Post vital signs: Reviewed and stable  Last Vitals:  Vitals Value Taken Time  BP 88/59 04/03/23 0859  Temp    Pulse 58 04/03/23 0901  Resp 15 04/03/23 0901  SpO2 99 % 04/03/23 0901  Vitals shown include unvalidated device data.  Last Pain:  Vitals:   04/03/23 0859  TempSrc:   PainSc: Asleep         Complications: No notable events documented.

## 2023-04-03 NOTE — Anesthesia Procedure Notes (Signed)
Procedure Name: General with mask airway Date/Time: 04/03/2023 8:29 AM  Performed by: Mohammed Kindle, CRNAPre-anesthesia Checklist: Patient identified, Emergency Drugs available, Suction available and Patient being monitored Patient Re-evaluated:Patient Re-evaluated prior to induction Oxygen Delivery Method: Simple face mask Induction Type: IV induction Placement Confirmation: positive ETCO2, CO2 detector and breath sounds checked- equal and bilateral Dental Injury: Teeth and Oropharynx as per pre-operative assessment

## 2023-04-06 ENCOUNTER — Encounter: Payer: Self-pay | Admitting: Gastroenterology

## 2023-05-07 ENCOUNTER — Ambulatory Visit (INDEPENDENT_AMBULATORY_CARE_PROVIDER_SITE_OTHER): Payer: No Typology Code available for payment source | Admitting: Dermatology

## 2023-05-07 VITALS — BP 148/89 | HR 86

## 2023-05-07 DIAGNOSIS — Z1283 Encounter for screening for malignant neoplasm of skin: Secondary | ICD-10-CM | POA: Diagnosis not present

## 2023-05-07 DIAGNOSIS — D239 Other benign neoplasm of skin, unspecified: Secondary | ICD-10-CM

## 2023-05-07 DIAGNOSIS — L4050 Arthropathic psoriasis, unspecified: Secondary | ICD-10-CM

## 2023-05-07 DIAGNOSIS — L821 Other seborrheic keratosis: Secondary | ICD-10-CM | POA: Diagnosis not present

## 2023-05-07 DIAGNOSIS — D1801 Hemangioma of skin and subcutaneous tissue: Secondary | ICD-10-CM

## 2023-05-07 DIAGNOSIS — L820 Inflamed seborrheic keratosis: Secondary | ICD-10-CM

## 2023-05-07 DIAGNOSIS — Z79899 Other long term (current) drug therapy: Secondary | ICD-10-CM

## 2023-05-07 DIAGNOSIS — L304 Erythema intertrigo: Secondary | ICD-10-CM

## 2023-05-07 DIAGNOSIS — D492 Neoplasm of unspecified behavior of bone, soft tissue, and skin: Secondary | ICD-10-CM

## 2023-05-07 DIAGNOSIS — D2272 Melanocytic nevi of left lower limb, including hip: Secondary | ICD-10-CM | POA: Diagnosis not present

## 2023-05-07 DIAGNOSIS — Z7189 Other specified counseling: Secondary | ICD-10-CM

## 2023-05-07 DIAGNOSIS — W908XXA Exposure to other nonionizing radiation, initial encounter: Secondary | ICD-10-CM

## 2023-05-07 DIAGNOSIS — D229 Melanocytic nevi, unspecified: Secondary | ICD-10-CM

## 2023-05-07 DIAGNOSIS — L918 Other hypertrophic disorders of the skin: Secondary | ICD-10-CM

## 2023-05-07 DIAGNOSIS — L409 Psoriasis, unspecified: Secondary | ICD-10-CM

## 2023-05-07 DIAGNOSIS — L814 Other melanin hyperpigmentation: Secondary | ICD-10-CM

## 2023-05-07 DIAGNOSIS — L578 Other skin changes due to chronic exposure to nonionizing radiation: Secondary | ICD-10-CM

## 2023-05-07 DIAGNOSIS — D2371 Other benign neoplasm of skin of right lower limb, including hip: Secondary | ICD-10-CM

## 2023-05-07 DIAGNOSIS — D2372 Other benign neoplasm of skin of left lower limb, including hip: Secondary | ICD-10-CM

## 2023-05-07 HISTORY — DX: Other benign neoplasm of skin, unspecified: D23.9

## 2023-05-07 MED ORDER — HYDROCORTISONE 2.5 % EX CREA: TOPICAL_CREAM | CUTANEOUS | 11 refills | Status: AC

## 2023-05-07 NOTE — Patient Instructions (Addendum)
Wound Care Instructions  Cleanse wound gently with soap and water once a day then pat dry with clean gauze. Apply a thin coat of Petrolatum (petroleum jelly, "Vaseline") over the wound (unless you have an allergy to this). We recommend that you use a new, sterile tube of Vaseline. Do not pick or remove scabs. Do not remove the yellow or white "healing tissue" from the base of the wound.  Cover the wound with fresh, clean, nonstick gauze and secure with paper tape. You may use Band-Aids in place of gauze and tape if the wound is small enough, but would recommend trimming much of the tape off as there is often too much. Sometimes Band-Aids can irritate the skin.  You should call the office for your biopsy report after 1 week if you have not already been contacted.  If you experience any problems, such as abnormal amounts of bleeding, swelling, significant bruising, significant pain, or evidence of infection, please call the office immediately.  FOR ADULT SURGERY PATIENTS: If you need something for pain relief you may take 1 extra strength Tylenol (acetaminophen) AND 2 Ibuprofen (200mg  each) together every 4 hours as needed for pain. (do not take these if you are allergic to them or if you have a reason you should not take them.) Typically, you may only need pain medication for 1 to 3 days.     Cryotherapy Aftercare  Wash gently with soap and water everyday.   Apply Vaseline and Band-Aid daily until healed.    Instructions for [facility] Medications  One or more of your medications was sent to the [facility] mail order compounding pharmacy. You will receive an email from them and can purchase the medicine through that link. It will then be mailed to your home at the address you confirmed. If for any reason you do not receive an email from them, please check your spam folder. If you still do not find the email, please let us know. [facility] phone number is [phone].       Due to recent changes in healthcare laws, you may see results of your pathology and/or laboratory studies on MyChart before the doctors have had a chance to review them. We understand that in some cases there may be results that are confusing or concerning to you. Please understand that not all results are received at the same time and often the doctors may need to interpret multiple results in order to provide you with the best plan of care or course of treatment. Therefore, we ask that you please give [place] 2 business days to thoroughly review all your results before contacting the office for clarification. Should we see a critical lab result, you will be contacted sooner.   If You Need Anything After Your Visit  If you have any questions or concerns for your doctor, please call our main line at [phone] and press option 4 to reach your doctor's medical assistant. If no one answers, please leave a voicemail as directed and we will return your call as soon as possible. Messages left after 4 pm will be answered the following business day.   You may also send [place] a message via MyChart. We typically respond to MyChart messages within 1-2 business days.  For prescription refills, please ask your pharmacy to contact our office. Our fax number is [phone].  If you have an urgent issue when the clinic is closed that cannot wait until the next business day, you can page your  doctor at the number below.    Please note that while we do our best to be available for urgent issues outside of office hours, we are not available 24/7.   If you have an urgent issue and are unable to reach [place], you may choose to seek medical care at your doctor's office, retail clinic, urgent care center, or emergency room.  If you have a medical emergency, please immediately call 911 or go to the emergency department.  Pager Numbers  - Dr. Gwen Pounds: [phone]  - Dr. Neale Burly: [phone]  - Dr. Roseanne Reno:  [phone]  In the event of inclement weather, please call our main line at [phone] for an update on the status of any delays or closures.  Dermatology Medication Tips: Please keep the boxes that topical medications come in in order to help keep track of the instructions about where and how to use these. Pharmacies typically print the medication instructions only on the boxes and not directly on the medication tubes.   If your medication is too expensive, please contact our office at [phone] option 4 or send [place] a message through MyChart.   We are unable to tell what your co-pay for medications will be in advance as this is different depending on your insurance coverage. However, we may be able to find a substitute medication at lower cost or fill out paperwork to get insurance to cover a needed medication.   If a prior authorization is required to get your medication covered by your insurance company, please allow [place] 1-2 business days to complete this process.  Drug prices often vary depending on where the prescription is filled and some pharmacies may offer cheaper prices.  The website www.goodrx.com contains coupons for medications through different pharmacies. The prices here do not account for what the cost may be with help from insurance (it may be cheaper with your insurance), but the website can give you the price if you did not use any insurance.  - You can print the associated coupon and take it with your prescription to the pharmacy.  - You may also stop by our office during regular business hours and pick up a GoodRx coupon card.  - If you need your prescription sent electronically to a different pharmacy, notify our office through [facility] or by phone at [phone] option 4.     Si Usted Necesita Algo Despus de Su Visita  Tambin puede enviarnos un mensaje a travs de Clinical cytogeneticist. Por lo general respondemos a los mensajes de MyChart en el transcurso de 1 a 2  das hbiles.  Para renovar recetas, por favor pida a su farmacia que se ponga en contacto con nuestra oficina. Annie Sable de fax es [place] [phone].  Si tiene un asunto urgente cuando la clnica est cerrada y que no puede esperar hasta el siguiente da hbil, puede llamar/localizar a su doctor(a) al nmero que aparece a continuacin.   Por favor, tenga en cuenta que aunque hacemos todo lo posible para estar disponibles para asuntos urgentes fuera del horario de [place], no estamos disponibles las 24 horas del da, los 7 [address]  [address] de la [place].   Si tiene un problema urgente y no puede comunicarse con nosotros, puede optar por buscar atencin mdica  en el consultorio de su doctor(a), en una clnica privada, en un centro de atencin urgente o en una sala de emergencias.  Si tiene Engineer, drilling, por favor llame inmediatamente al 911 o vaya a la sala de emergencias.  Nmeros de bper  - Dr. Gwen Pounds: [phone]  - Dra. Moye: [phone]  - Dra. Roseanne Reno: [phone]  En caso de inclemencias del [place], por favor llame a Lacy Duverney principal al [phone] para una actualizacin sobre el [place] de cualquier retraso o cierre.  Consejos para la medicacin en dermatologa: Por favor, guarde las cajas en las que vienen los medicamentos de uso tpico para ayudarle a seguir las instrucciones sobre dnde y cmo usarlos. Las farmacias generalmente imprimen las instrucciones del medicamento slo en las cajas y no directamente en los tubos del [place].   Si su medicamento es muy caro, por favor, pngase en contacto con Rolm Gala llamando al [phone] y presione la opcin 4 o envenos un mensaje a travs de Clinical cytogeneticist.   No podemos decirle cul ser su copago por los medicamentos por adelantado ya que esto es diferente dependiendo de la cobertura de su seguro. Sin embargo, es posible que podamos encontrar un medicamento sustituto a Audiological scientist un formulario para que el  seguro cubra el medicamento que se considera necesario.   Si se requiere una autorizacin previa para que su compaa de seguros [place] su medicamento, por favor permtanos de 1 a 2 das hbiles para completar [address]  Los precios de los medicamentos varan con frecuencia dependiendo del Environmental consultant de dnde se surte la receta y alguna farmacias pueden ofrecer precios ms baratos.  El sitio web www.goodrx.com tiene cupones para medicamentos de Health and safety inspector. Los precios aqu no tienen en cuenta lo que podra costar con la ayuda del seguro (puede ser ms barato con su seguro), pero el sitio web puede darle el precio si no utiliz Tourist information centre manager.  - Puede imprimir el cupn correspondiente y llevarlo con su receta a la farmacia.  - Tambin puede pasar por nuestra oficina durante el horario de atencin regular y Education officer, museum una tarjeta de cupones de GoodRx.  - Si necesita que su receta se enve electrnicamente a una farmacia diferente, informe a nuestra oficina a travs de MyChart de [place] o por telfono llamando al [phone] y presione la opcin 4.

## 2023-05-07 NOTE — Progress Notes (Signed)
New Patient Visit   Subjective  Mike Cooley is a 51 y.o. male who presents for the following: Skin Cancer Screening and Full Body Skin Exam, patient c/o Psoriatic arthritis-being treated with Cosentyx by his rheumatologist.    The patient presents for Total-Body Skin Exam (TBSE) for skin cancer screening and mole check. The patient has spots, moles and lesions to be evaluated, some may be new or changing and the patient may have concern these could be cancer.   The following portions of the chart were reviewed this encounter and updated as appropriate: medications, allergies, medical history  Review of Systems:  No other skin or systemic complaints except as noted in HPI or Assessment and Plan.  Objective  Well appearing patient in no apparent distress; mood and affect are within normal limits.  A full examination was performed including scalp, head, eyes, ears, nose, lips, neck, chest, axillae, abdomen, back, buttocks, bilateral upper extremities, bilateral lower extremities, hands, feet, fingers, toes, fingernails, and toenails. All findings within normal limits unless otherwise noted below.   Relevant physical exam findings are noted in the Assessment and Plan.  neck x 1 Stuck-on, waxy, tan-brown papules--Discussed benign etiology and prognosis.   left side above waistline 0.6 cm dark brown irregular macule         Assessment & Plan   SKIN CANCER SCREENING PERFORMED TODAY.  ACTINIC DAMAGE - Chronic condition, secondary to cumulative UV/sun exposure - diffuse scaly erythematous macules with underlying dyspigmentation - Recommend daily broad spectrum sunscreen SPF 30+ to sun-exposed areas, reapply every 2 hours as needed.  - Staying in the shade or wearing long sleeves, sun glasses (UVA+UVB protection) and wide brim hats (4-inch brim around the entire circumference of the hat) are also recommended for sun protection.  - Call for new or changing  lesions.  LENTIGINES, SEBORRHEIC KERATOSES, HEMANGIOMAS - Benign normal skin lesions - Benign-appearing - Call for any changes  MELANOCYTIC NEVI - Tan-brown and/or pink-flesh-colored symmetric macules and papules - Benign appearing on exam today - Observation - Call clinic for new or changing moles - Recommend daily use of broad spectrum spf 30+ sunscreen to sun-exposed areas.    Inflamed seborrheic keratosis neck x 1  Symptomatic, irritating, patient would like treated.   Destruction of lesion - neck x 1 Complexity: simple   Destruction method: cryotherapy   Informed consent: discussed and consent obtained   Timeout:  patient name, date of birth, surgical site, and procedure verified Lesion destroyed using liquid nitrogen: Yes   Region frozen until ice ball extended beyond lesion: Yes   Outcome: patient tolerated procedure well with no complications   Post-procedure details: wound care instructions given    Neoplasm of skin left side above waistline  Epidermal / dermal shaving  Lesion diameter (cm):  0.6 Informed consent: discussed and consent obtained   Timeout: patient name, date of birth, surgical site, and procedure verified   Procedure prep:  Patient was prepped and draped in usual sterile fashion Prep type:  Isopropyl alcohol Anesthesia: the lesion was anesthetized in a standard fashion   Anesthetic:  1% lidocaine w/ epinephrine 1-100,000 buffered w/ 8.4% NaHCO3 Hemostasis achieved with: pressure, aluminum chloride and electrodesiccation   Outcome: patient tolerated procedure well   Post-procedure details: sterile dressing applied and wound care instructions given   Dressing type: bandage and petrolatum    Specimen 1 - Surgical pathology Differential Diagnosis: R/O Dysplastic nevus   Check Margins: Yes   R/O Dysplastic nevus  PSORIASIS/PSORIATIC ARTHRITIS    Psoriasis is a chronic non-curable, but treatable genetic/hereditary disease that may have  other systemic features affecting other organ systems such as joints (Psoriatic Arthritis). It is associated with an increased risk of inflammatory bowel disease, heart disease, non-alcoholic fatty liver disease, and depression.  Treatments include light and laser treatments; topical medications; and systemic medications including oral and injectables.  Treatment Plan: Continue Cosentyx prescribed by Rheumatologist Dr Imogene Burn term medication management.  Patient is using long term (months to years) prescription medication  to control their dermatologic condition.  These medications require periodic monitoring to evaluate for efficacy and side effects and may require periodic laboratory monitoring.   INTERTRIGO Exam; axillary  Erythema and edema, lichenification and dyschromia   Chronic and persistent condition with duration or expected duration over one year. Condition is symptomatic/ bothersome to patient. Not currently at goal.   Intertrigo is a chronic recurrent rash that occurs in skin fold areas that may be associated with friction; heat; moisture; yeast; fungus; and bacteria.  It is exacerbated by increased movement / activity; sweating; and higher atmospheric temperature.  Treatment Plan Start [facility] medicinals Intertrigo cream apply to arms Monday -Friday at bedtime   DERMATOFIBROMA Exam: Firm pink/brown papulenodule with dimple sign at the left medial calf, right thigh  Treatment Plan: A dermatofibroma is a benign growth possibly related to trauma, such as an insect bite, cut from shaving, or inflamed acne-type bump.  Treatment options to remove include shave or excision with resulting scar and risk of recurrence.  Since benign-appearing and not bothersome, will observe for now.    Acrochordons (Skin Tags) Groin x 1 Left axilla x 2 - Fleshy, skin-colored pedunculated papules - Benign appearing.  - Observe. - If desired, they can be removed with an in office procedure that is  not covered by insurance. - Please call the clinic if you notice any new or changing lesions. -Patient will call his insurance to find out if skin tag removal  covered by his insurance then he will call here to be worked in the schedule for skin tag removal.   Return in about 1 year (around 05/06/2024) for TBSE, hx of AKs .  IAngelique Holm, CMA, am acting as scribe for Armida Sans, MD .   Documentation: I have reviewed the above documentation for accuracy and completeness, and I agree with the above.  Armida Sans, MD

## 2023-05-08 ENCOUNTER — Encounter: Payer: Self-pay | Admitting: Dermatology

## 2023-05-18 ENCOUNTER — Telehealth: Payer: Self-pay

## 2023-05-18 NOTE — Telephone Encounter (Signed)
Left voicemail to return my call

## 2023-05-18 NOTE — Telephone Encounter (Signed)
-----   Message from Armida Sans sent at 05/16/2023  7:55 PM EDT ----- Diagnosis Skin , left side above waistline DYSPLASTIC COMPOUND NEVUS WITH MILD ATYPIA SUPERIMPOSED ON PIGMENTED SEBORRHEIC KERATOSIS, PERIPHERAL MARGIN INVOLVED  Mild dysplastic Recheck next visit

## 2023-05-19 ENCOUNTER — Telehealth: Payer: Self-pay

## 2023-05-19 NOTE — Telephone Encounter (Signed)
Left message on voicemail to return my call.  

## 2023-05-19 NOTE — Telephone Encounter (Signed)
-----   Message from Armida Sans sent at 05/16/2023  7:55 PM EDT ----- Diagnosis Skin , left side above waistline DYSPLASTIC COMPOUND NEVUS WITH MILD ATYPIA SUPERIMPOSED ON PIGMENTED SEBORRHEIC KERATOSIS, PERIPHERAL MARGIN INVOLVED  Mild dysplastic Recheck next visit

## 2023-05-26 ENCOUNTER — Telehealth: Payer: Self-pay

## 2023-05-26 NOTE — Telephone Encounter (Addendum)
Tried calling patient regarding results. No answer. LMOM for patient to return call. ----- Message from Armida Sans sent at 05/16/2023  7:55 PM EDT ----- Diagnosis Skin , left side above waistline DYSPLASTIC COMPOUND NEVUS WITH MILD ATYPIA SUPERIMPOSED ON PIGMENTED SEBORRHEIC KERATOSIS, PERIPHERAL MARGIN INVOLVED  Mild dysplastic Recheck next visit

## 2023-05-28 ENCOUNTER — Telehealth: Payer: Self-pay

## 2023-05-28 NOTE — Telephone Encounter (Signed)
Left message on voicemail to return my call.  

## 2023-05-28 NOTE — Telephone Encounter (Signed)
-----   Message from Armida Sans sent at 05/16/2023  7:55 PM EDT ----- Diagnosis Skin , left side above waistline DYSPLASTIC COMPOUND NEVUS WITH MILD ATYPIA SUPERIMPOSED ON PIGMENTED SEBORRHEIC KERATOSIS, PERIPHERAL MARGIN INVOLVED  Mild dysplastic Recheck next visit

## 2024-06-16 ENCOUNTER — Ambulatory Visit: Payer: No Typology Code available for payment source | Admitting: Dermatology

## 2024-06-21 ENCOUNTER — Ambulatory Visit: Admitting: Dermatology

## 2024-06-21 ENCOUNTER — Encounter: Payer: Self-pay | Admitting: Dermatology

## 2024-06-21 DIAGNOSIS — L918 Other hypertrophic disorders of the skin: Secondary | ICD-10-CM | POA: Diagnosis not present

## 2024-06-21 DIAGNOSIS — D229 Melanocytic nevi, unspecified: Secondary | ICD-10-CM

## 2024-06-21 DIAGNOSIS — Z1283 Encounter for screening for malignant neoplasm of skin: Secondary | ICD-10-CM | POA: Diagnosis not present

## 2024-06-21 DIAGNOSIS — Z872 Personal history of diseases of the skin and subcutaneous tissue: Secondary | ICD-10-CM

## 2024-06-21 DIAGNOSIS — D239 Other benign neoplasm of skin, unspecified: Secondary | ICD-10-CM

## 2024-06-21 DIAGNOSIS — L814 Other melanin hyperpigmentation: Secondary | ICD-10-CM

## 2024-06-21 DIAGNOSIS — D2371 Other benign neoplasm of skin of right lower limb, including hip: Secondary | ICD-10-CM

## 2024-06-21 DIAGNOSIS — D2372 Other benign neoplasm of skin of left lower limb, including hip: Secondary | ICD-10-CM

## 2024-06-21 DIAGNOSIS — L578 Other skin changes due to chronic exposure to nonionizing radiation: Secondary | ICD-10-CM | POA: Diagnosis not present

## 2024-06-21 DIAGNOSIS — Z7189 Other specified counseling: Secondary | ICD-10-CM

## 2024-06-21 DIAGNOSIS — L408 Other psoriasis: Secondary | ICD-10-CM | POA: Diagnosis not present

## 2024-06-21 DIAGNOSIS — L409 Psoriasis, unspecified: Secondary | ICD-10-CM

## 2024-06-21 DIAGNOSIS — Z79899 Other long term (current) drug therapy: Secondary | ICD-10-CM

## 2024-06-21 DIAGNOSIS — Z86018 Personal history of other benign neoplasm: Secondary | ICD-10-CM

## 2024-06-21 DIAGNOSIS — W908XXA Exposure to other nonionizing radiation, initial encounter: Secondary | ICD-10-CM

## 2024-06-21 DIAGNOSIS — L4050 Arthropathic psoriasis, unspecified: Secondary | ICD-10-CM

## 2024-06-21 MED ORDER — ZORYVE 0.3 % EX CREA
TOPICAL_CREAM | CUTANEOUS | 6 refills | Status: AC
Start: 2024-06-21 — End: ?

## 2024-06-21 NOTE — Patient Instructions (Addendum)
 For psoriasis   Start zoryve  cream (sample given ) use twice daily to affected under arms for psoriasis     Melanoma ABCDEs  Melanoma is the most dangerous type of skin cancer, and is the leading cause of death from skin disease.  You are more likely to develop melanoma if you: Have light-colored skin, light-colored eyes, or red or blond hair Spend a lot of time in the sun Tan regularly, either outdoors or in a tanning bed Have had blistering sunburns, especially during childhood Have a close family member who has had a melanoma Have atypical moles or large birthmarks  Early detection of melanoma is key since treatment is typically straightforward and cure rates are extremely high if we catch it early.   The first sign of melanoma is often a change in a mole or a new dark spot.  The ABCDE system is a way of remembering the signs of melanoma.  A for asymmetry:  The two halves do not match. B for border:  The edges of the growth are irregular. C for color:  A mixture of colors are present instead of an even brown color. D for diameter:  Melanomas are usually (but not always) greater than 6mm - the size of a pencil eraser. E for evolution:  The spot keeps changing in size, shape, and color.  Please check your skin once per month between visits. You can use a small mirror in front and a large mirror behind you to keep an eye on the back side or your body.   If you see any new or changing lesions before your next follow-up, please call to schedule a visit.  Please continue daily skin protection including broad spectrum sunscreen SPF 30+ to sun-exposed areas, reapplying every 2 hours as needed when you're outdoors.   Staying in the shade or wearing long sleeves, sun glasses (UVA+UVB protection) and wide brim hats (4-inch brim around the entire circumference of the hat) are also recommended for sun protection.    Due to recent changes in healthcare laws, you may see results of your  pathology and/or laboratory studies on MyChart before the doctors have had a chance to review them. We understand that in some cases there may be results that are confusing or concerning to you. Please understand that not all results are received at the same time and often the doctors may need to interpret multiple results in order to provide you with the best plan of care or course of treatment. Therefore, we ask that you please give us  2 business days to thoroughly review all your results before contacting the office for clarification. Should we see a critical lab result, you will be contacted sooner.   If You Need Anything After Your Visit  If you have any questions or concerns for your doctor, please call our main line at [phone] and press option 4 to reach your doctor's medical assistant. If no one answers, please leave a voicemail as directed and we will return your call as soon as possible. Messages left after 4 pm will be answered the following business day.   You may also send us  a message via MyChart. We typically respond to MyChart messages within 1-2 business days.  For prescription refills, please ask your pharmacy to contact our office. Our fax number is [phone].  If you have an urgent issue when the clinic is closed that cannot wait until the next business day, you can page your doctor at the number  below.    Please note that while we do our best to be available for urgent issues outside of office hours, we are not available 24/7.   If you have an urgent issue and are unable to reach us , you may choose to seek medical care at your doctor's office, retail clinic, urgent care center, or emergency room.  If you have a medical emergency, please immediately call 911 or go to the emergency department.  Pager Numbers  - Dr. Hester: [phone]  - Dr. Jackquline: [phone]  - Dr. Claudene: [phone]   - Dr. Raymund: [phone]  In the event of inclement weather,  please call our main line at [phone] for an update on the status of any delays or closures.  Dermatology Medication Tips: Please keep the boxes that topical medications come in in order to help keep track of the instructions about where and how to use these. Pharmacies typically print the medication instructions only on the boxes and not directly on the medication tubes.   If your medication is too expensive, please contact our office at [phone] option 4 or send us  a message through MyChart.   We are unable to tell what your co-pay for medications will be in advance as this is different depending on your insurance coverage. However, we may be able to find a substitute medication at lower cost or fill out paperwork to get insurance to cover a needed medication.   If a prior authorization is required to get your medication covered by your insurance company, please allow us  1-2 business days to complete this process.  Drug prices often vary depending on where the prescription is filled and some pharmacies may offer cheaper prices.  The website www.goodrx.com contains coupons for medications through different pharmacies. The prices here do not account for what the cost may be with help from insurance (it may be cheaper with your insurance), but the website can give you the price if you did not use any insurance.  - You can print the associated coupon and take it with your prescription to the pharmacy.  - You may also stop by our office during regular business hours and pick up a GoodRx coupon card.  - If you need your prescription sent electronically to a different pharmacy, notify our office through [facility] or by phone at [phone] option 4.     Si Usted Necesita Algo Despus de Su Visita  Tambin puede enviarnos un mensaje a travs de Clinical cytogeneticist. Por lo general respondemos a los mensajes de MyChart en el transcurso de 1 a 2 das hbiles.  Para renovar recetas, por favor  pida a su farmacia que se ponga en contacto con nuestra oficina. Randi lakes de fax es [place] [phone].  Si tiene un asunto urgente cuando la clnica est cerrada y que no puede esperar hasta el siguiente da hbil, puede llamar/localizar a su doctor(a) al nmero que aparece a continuacin.   Por favor, tenga en cuenta que aunque hacemos todo lo posible para estar disponibles para asuntos urgentes fuera del horario de [place], no estamos disponibles las 24 horas del da, los 7 [address]  [address] de la [place].   Si tiene un problema urgente y no puede comunicarse con nosotros, puede optar por buscar atencin mdica  en el consultorio de su doctor(a), en una clnica privada, en un centro de atencin urgente o en una sala de emergencias.  Si tiene Engineer, drilling, por favor llame inmediatamente al 911 o vaya a la sala  de emergencias.  Nmeros de bper  - Dr. Hester: [phone]  - Dra. Jackquline: [phone]  - Dr. Claudene: [phone]  - Dra. Kitts: [phone]  En caso de inclemencias del [place], por favor llame a nuestra lnea principal al [phone] para una actualizacin sobre el estado de cualquier retraso o cierre.  Consejos para la medicacin en dermatologa: Por favor, guarde las cajas en las que vienen los medicamentos de uso tpico para ayudarle a seguir las instrucciones sobre dnde y cmo usarlos. Las farmacias generalmente imprimen las instrucciones del medicamento slo en las cajas y no directamente en los tubos del [place].   Si su medicamento es muy caro, por favor, pngase en contacto con landry rieger llamando al [phone] y presione la opcin 4 o envenos un mensaje a travs de Clinical cytogeneticist.   No podemos decirle cul ser su copago por los medicamentos por adelantado ya que esto es diferente dependiendo de la cobertura de su seguro. Sin embargo, es posible que podamos encontrar un medicamento sustituto a Audiological scientist un formulario para que el seguro cubra el  medicamento que se considera necesario.   Si se requiere una autorizacin previa para que su compaa de seguros malta su medicamento, por favor permtanos de 1 a 2 das hbiles para completar este proceso.  Los precios de los medicamentos varan con frecuencia dependiendo del Environmental consultant de dnde se surte la receta y alguna farmacias pueden ofrecer precios ms baratos.  El sitio web www.goodrx.com tiene cupones para medicamentos de Health and safety inspector. Los precios aqu no tienen en cuenta lo que podra costar con la ayuda del seguro (puede ser ms barato con su seguro), pero el sitio web puede darle el precio si no utiliz Tourist information centre manager.  - Puede imprimir el cupn correspondiente y llevarlo con su receta a la farmacia.  - Tambin puede pasar por nuestra oficina durante el horario de atencin regular y Education officer, museum una tarjeta de cupones de GoodRx.  - Si necesita que su receta se enve electrnicamente a una farmacia diferente, informe a nuestra oficina a travs de MyChart de [place] o por telfono llamando al [phone] y presione la opcin 4.

## 2024-06-21 NOTE — Progress Notes (Signed)
 Follow-Up Visit   Subjective  Mike Cooley is a 53 y.o. male who presents for the following: Skin Cancer Screening and Full Body Skin Exam Hx of dysplastic nevus, hx of isks, hx of psoriasis using cosentyx prescribed by rheumatologist , hx of intertrigo reports sm cream did not help for rash under arms.   The patient presents for Total-Body Skin Exam (TBSE) for skin cancer screening and mole check. The patient has spots, moles and lesions to be evaluated, some may be new or changing and the patient may have concern these could be cancer.  The following portions of the chart were reviewed this encounter and updated as appropriate: medications, allergies, medical history  Review of Systems:  No other skin or systemic complaints except as noted in HPI or Assessment and Plan.  Objective  Well appearing patient in no apparent distress; mood and affect are within normal limits.  A full examination was performed including scalp, head, eyes, ears, nose, lips, neck, chest, axillae, abdomen, back, buttocks, bilateral upper extremities, bilateral lower extremities, hands, feet, fingers, toes, fingernails, and toenails. All findings within normal limits unless otherwise noted below.   Relevant physical exam findings are noted in the Assessment and Plan.   Assessment & Plan   SKIN CANCER SCREENING PERFORMED TODAY.  ACTINIC DAMAGE - Chronic condition, secondary to cumulative UV/sun exposure - diffuse scaly erythematous macules with underlying dyspigmentation - Recommend daily broad spectrum sunscreen SPF 30+ to sun-exposed areas, reapply every 2 hours as needed.  - Staying in the shade or wearing long sleeves, sun glasses (UVA+UVB protection) and wide brim hats (4-inch brim around the entire circumference of the hat) are also recommended for sun protection.  - Call for new or changing lesions.  LENTIGINES, SEBORRHEIC KERATOSES, HEMANGIOMAS - Benign normal skin lesions -  Benign-appearing - Call for any changes  MELANOCYTIC NEVI - Tan-brown and/or pink-flesh-colored symmetric macules and papules - Benign appearing on exam today - Observation - Call clinic for new or changing moles - Recommend daily use of broad spectrum spf 30+ sunscreen to sun-exposed areas.   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 Condition is symptomatic / bothersome to patient. Not to goal. Psoriasis is a chronic non-curable, but treatable genetic/hereditary disease that may have other systemic features affecting other organ systems such as joints (Psoriatic Arthritis). It is associated with an increased risk of inflammatory bowel disease, heart disease, non-alcoholic fatty liver disease, and depression.  Treatments include light and laser treatments; topical medications; and systemic medications including oral and injectables. Treatment Plan: Continue Cosentyx prescribed by Rheumatologist Dr Tobie   Does well with arthritis on cosentyx  Discussed topicals such at zoryve  can be used when flared. Start Zoryve  0.3 % cream - apply twice daily to aa's of psoriasis   Long term medication management.  Patient is using long term (months to years) prescription medication  to control their dermatologic condition.  These medications require periodic monitoring to evaluate for efficacy and side effects and may require periodic laboratory monitoring.    VASCULAR BIRTHMARK at Posterior neck Exam: Pink vascular macule posterior neck Treatment Plan: Benign-appearing.  Observation.  Call clinic for new or changing lesions.  Recommend daily use of broad spectrum spf 30+ sunscreen to sun-exposed areas.    DERMATOFIBROMA Exam: Firm pink/brown papulenodule with dimple sign at the left medial calf, right thigh  Treatment Plan: A dermatofibroma  is a benign  growth possibly related to trauma, such as an insect bite, cut from shaving, or inflamed acne-type bump.  Treatment options to remove include shave or excision with resulting scar and risk of recurrence.  Since benign-appearing and not bothersome, will observe for now.  Acrochordons (Skin Tags) Groin x 1 Left axilla x 2 - Fleshy, skin-colored pedunculated papules - Benign appearing.  - Observe. - If desired, they can be removed with an in office procedure that may not covered by insurance. - Please call the clinic if you notice any new or changing lesions. -Patient reports insurance will not likely cover skin tag removal.   Patient states he has 100% coverage through his [employer] benefits. He asks that we reach out to his PCP to request that his PCP ask for referral from the [place] to dermatology for treatment and removal of skin tags that are symptomatic and the patient would like removed.   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  Call if any new or changing lesions are noted between office visits  PSORIASIS   Related Medications Roflumilast  (ZORYVE ) 0.3 % CREA Apply twice daily to elbows for psoriasis SKIN TAG   Return in about 1 year (around 06/21/2025) for TBSE.  IEleanor Blush, CMA, am acting as scribe for Alm Rhyme, MD.   Documentation: I have reviewed the above documentation for accuracy and completeness, and I agree with the above.  Alm Rhyme, MD

## 2024-06-22 NOTE — Addendum Note (Signed)
 Addended by: TANDA SETTER A on: 06/22/2024 07:44 AM   Modules accepted: Orders

## 2024-06-22 NOTE — Addendum Note (Signed)
 Addended by: TANDA SETTER A on: 06/22/2024 05:42 PM   Modules accepted: Orders

## 2025-06-27 ENCOUNTER — Ambulatory Visit: Admitting: Dermatology
